# Patient Record
Sex: Male | Born: 1980 | Race: White | Hispanic: No | Smoking: Former smoker
Health system: Southern US, Community
[De-identification: ages and names within clinical notes are randomized; demographics above are authoritative.]

## PROBLEM LIST (undated history)

## (undated) DIAGNOSIS — T7840XA Allergy, unspecified, initial encounter: Secondary | ICD-10-CM

## (undated) DIAGNOSIS — E785 Hyperlipidemia, unspecified: Secondary | ICD-10-CM

## (undated) DIAGNOSIS — E119 Type 2 diabetes mellitus without complications: Secondary | ICD-10-CM

## (undated) DIAGNOSIS — K219 Gastro-esophageal reflux disease without esophagitis: Secondary | ICD-10-CM

## (undated) DIAGNOSIS — I10 Essential (primary) hypertension: Secondary | ICD-10-CM

## (undated) DIAGNOSIS — K859 Acute pancreatitis without necrosis or infection, unspecified: Secondary | ICD-10-CM

## (undated) HISTORY — PX: HERNIA REPAIR: SHX51

## (undated) HISTORY — DX: Allergy, unspecified, initial encounter: T78.40XA

## (undated) HISTORY — DX: Gastro-esophageal reflux disease without esophagitis: K21.9

## (undated) HISTORY — PX: TONSILLECTOMY: SUR1361

---

## 2004-06-28 ENCOUNTER — Emergency Department: Payer: Self-pay | Admitting: Emergency Medicine

## 2006-01-16 ENCOUNTER — Ambulatory Visit: Payer: Self-pay | Admitting: Otolaryngology

## 2008-05-28 ENCOUNTER — Ambulatory Visit: Payer: Self-pay | Admitting: General Practice

## 2008-07-10 ENCOUNTER — Ambulatory Visit: Payer: Self-pay | Admitting: Family

## 2010-01-10 ENCOUNTER — Ambulatory Visit: Payer: Self-pay | Admitting: General Practice

## 2010-01-20 ENCOUNTER — Ambulatory Visit: Payer: Self-pay | Admitting: General Practice

## 2011-11-30 ENCOUNTER — Inpatient Hospital Stay: Payer: Self-pay | Admitting: Internal Medicine

## 2011-11-30 LAB — CBC
MCV: 85 fL (ref 80–100)
Platelet: 330 10*3/uL (ref 150–440)
RDW: 13.3 % (ref 11.5–14.5)
WBC: 15.4 10*3/uL — ABNORMAL HIGH (ref 3.8–10.6)

## 2011-11-30 LAB — URINALYSIS, COMPLETE
Bacteria: NONE SEEN
Bilirubin,UR: NEGATIVE
Blood: NEGATIVE
Glucose,UR: NEGATIVE mg/dL (ref 0–75)
Nitrite: NEGATIVE
RBC,UR: <1 /HPF (ref 0–5)
Specific Gravity: 1.018 (ref 1.003–1.030)
Squamous Epithelial: NONE SEEN
WBC UR: <1 /HPF (ref 0–5)

## 2011-11-30 LAB — LIPID PANEL
Cholesterol: 235 mg/dL — ABNORMAL HIGH (ref 0–200)
HDL Cholesterol: 22 mg/dL — ABNORMAL LOW (ref 40–60)
Triglycerides: 1901 mg/dL — ABNORMAL HIGH (ref 0–200)

## 2011-11-30 LAB — COMPREHENSIVE METABOLIC PANEL
Anion Gap: 10 (ref 7–16)
BUN: 14 mg/dL (ref 7–18)
Bilirubin,Total: 0.6 mg/dL (ref 0.2–1.0)
Chloride: 98 mmol/L (ref 98–107)
Co2: 29 mmol/L (ref 21–32)
Creatinine: 0.76 mg/dL (ref 0.60–1.30)
EGFR (African American): >60
EGFR (Non-African Amer.): >60
Osmolality: 280 (ref 275–301)
Total Protein: 8 g/dL (ref 6.4–8.2)

## 2011-11-30 LAB — HEMOGLOBIN A1C: Hemoglobin A1C: 6.9 % — ABNORMAL HIGH (ref 4.2–6.3)

## 2011-12-01 LAB — CBC WITH DIFFERENTIAL/PLATELET
Basophil #: 0 10*3/uL (ref 0.0–0.1)
Eosinophil #: 0.2 10*3/uL (ref 0.0–0.7)
Eosinophil %: 1.5 %
Lymphocyte %: 19.7 %
MCHC: 32.8 g/dL (ref 32.0–36.0)
Monocyte %: 6.8 %
Neutrophil #: 8.6 10*3/uL — ABNORMAL HIGH (ref 1.4–6.5)
Neutrophil %: 71.6 %
RBC: 4.7 10*6/uL (ref 4.40–5.90)
RDW: 13.6 % (ref 11.5–14.5)

## 2011-12-01 LAB — COMPREHENSIVE METABOLIC PANEL
Alkaline Phosphatase: 63 U/L (ref 50–136)
Anion Gap: 8 (ref 7–16)
Bilirubin,Total: 0.6 mg/dL (ref 0.2–1.0)
Calcium, Total: 8.9 mg/dL (ref 8.5–10.1)
Chloride: 100 mmol/L (ref 98–107)
EGFR (African American): >60
EGFR (Non-African Amer.): >60
Osmolality: 277 (ref 275–301)
Potassium: 4 mmol/L (ref 3.5–5.1)
SGOT(AST): 46 U/L — ABNORMAL HIGH (ref 15–37)
Total Protein: 7.2 g/dL (ref 6.4–8.2)

## 2011-12-02 LAB — CBC WITH DIFFERENTIAL/PLATELET
Eosinophil %: 4.8 %
Lymphocyte #: 2.8 10*3/uL (ref 1.0–3.6)
Lymphocyte %: 34.9 %
MCH: 28.7 pg (ref 26.0–34.0)
MCV: 85 fL (ref 80–100)
Monocyte #: 0.5 x10 3/mm (ref 0.2–1.0)
Monocyte %: 6.5 %
Neutrophil #: 4.3 10*3/uL (ref 1.4–6.5)
Platelet: 279 10*3/uL (ref 150–440)

## 2011-12-02 LAB — LIPASE, BLOOD: Lipase: 155 U/L (ref 73–393)

## 2011-12-06 LAB — CULTURE, BLOOD (SINGLE)

## 2012-09-25 ENCOUNTER — Emergency Department: Payer: Self-pay | Admitting: Emergency Medicine

## 2012-10-10 ENCOUNTER — Ambulatory Visit: Payer: Self-pay | Admitting: General Practice

## 2012-12-26 ENCOUNTER — Emergency Department: Payer: Self-pay | Admitting: Emergency Medicine

## 2012-12-26 LAB — COMPREHENSIVE METABOLIC PANEL
Albumin: 4.5 g/dL (ref 3.4–5.0)
Alkaline Phosphatase: 88 U/L (ref 50–136)
Anion Gap: 4 — ABNORMAL LOW (ref 7–16)
BUN: 19 mg/dL — ABNORMAL HIGH (ref 7–18)
Bilirubin,Total: 0.2 mg/dL (ref 0.2–1.0)
Calcium, Total: 10.3 mg/dL — ABNORMAL HIGH (ref 8.5–10.1)
Chloride: 104 mmol/L (ref 98–107)
Co2: 31 mmol/L (ref 21–32)
EGFR (Non-African Amer.): >60
Glucose: 130 mg/dL — ABNORMAL HIGH (ref 65–99)
Osmolality: 282 (ref 275–301)
Sodium: 139 mmol/L (ref 136–145)
Total Protein: 8.5 g/dL — ABNORMAL HIGH (ref 6.4–8.2)

## 2012-12-26 LAB — LIPASE, BLOOD: Lipase: 117 U/L (ref 73–393)

## 2012-12-26 LAB — CBC
HCT: 43.2 % (ref 40.0–52.0)
MCH: 28.7 pg (ref 26.0–34.0)
MCHC: 34.6 g/dL (ref 32.0–36.0)
MCV: 83 fL (ref 80–100)
RDW: 13 % (ref 11.5–14.5)

## 2012-12-27 LAB — URINALYSIS, COMPLETE
Glucose,UR: NEGATIVE mg/dL (ref 0–75)
Ketone: NEGATIVE
Leukocyte Esterase: NEGATIVE
Nitrite: NEGATIVE
Ph: 5 (ref 4.5–8.0)
Protein: 30
RBC,UR: <1 /HPF (ref 0–5)
Specific Gravity: 1.054 (ref 1.003–1.030)
WBC UR: <1 /HPF (ref 0–5)

## 2014-01-15 ENCOUNTER — Ambulatory Visit: Payer: Self-pay | Admitting: Endocrinology

## 2014-09-05 ENCOUNTER — Emergency Department
Admit: 2014-09-05 | Disposition: A | Discharge status: Home / Self Care | Payer: Self-pay | Admitting: Emergency Medicine

## 2014-09-05 LAB — CBC WITH DIFFERENTIAL/PLATELET
Basophil #: 0.1 x10 3/mm 3
Basophil %: 0.4 %
Eosinophil #: 0.1 x10 3/mm 3
Eosinophil %: 0.9 %
HCT: 43.9 %
HGB: 14.8 g/dL
Lymphocyte %: 16.3 %
Lymphs Abs: 2.3 x10 3/mm 3
MCH: 27.8 pg
MCHC: 33.6 g/dL
MCV: 83 fL
Monocyte #: 1.2 "x10 3/mm " — ABNORMAL HIGH
Monocyte %: 8.5 %
Neutrophil #: 10.3 x10 3/mm 3 — ABNORMAL HIGH
Neutrophil %: 73.9 %
Platelet: 289 x10 3/mm 3
RBC: 5.32 x10 6/mm 3
RDW: 13.2 %
WBC: 13.9 x10 3/mm 3 — ABNORMAL HIGH

## 2014-09-05 LAB — URINALYSIS, COMPLETE
BACTERIA: NONE SEEN
BILIRUBIN, UR: NEGATIVE
Glucose,UR: >=500 mg/dL (ref 0–75)
Leukocyte Esterase: NEGATIVE
Nitrite: NEGATIVE
Ph: 6 (ref 4.5–8.0)
Specific Gravity: 1.04 (ref 1.003–1.030)
Squamous Epithelial: NONE SEEN

## 2014-09-05 LAB — COMPREHENSIVE METABOLIC PANEL
ALK PHOS: 66 U/L
ALT: 42 U/L
ANION GAP: 9 (ref 7–16)
AST: 23 U/L
Albumin: 4.2 g/dL
BUN: 14 mg/dL
Bilirubin,Total: 1.3 mg/dL — ABNORMAL HIGH
CREATININE: 0.74 mg/dL
Calcium, Total: 9.4 mg/dL
Chloride: 98 mmol/L — ABNORMAL LOW
Co2: 28 mmol/L
Glucose: 250 mg/dL — ABNORMAL HIGH
POTASSIUM: 4 mmol/L
SODIUM: 135 mmol/L
Total Protein: 8 g/dL

## 2014-09-05 LAB — TROPONIN I: Troponin-I: <0.03 ng/mL

## 2014-09-05 LAB — LIPASE, BLOOD: LIPASE: 28 U/L

## 2014-09-13 NOTE — H&P (Signed)
PATIENT NAME:  Dale Mendoza, Dale Mendoza MR#:  [id number] DATE OF BIRTH:  December 01, 1980  DATE OF ADMISSION:  11/30/2011  ADMITTING PHYSICIAN: Dr. Enid Baasadhika Dhairya Corales  PRIMARY CARE PHYSICIAN: Nonlocal  CHIEF COMPLAINT: Abdominal pain.   HISTORY OF PRESENT ILLNESS: Dale Mendoza is a pleasant 34 year old obese Caucasian male with past medical history significant for non-insulin-dependent diabetes mellitus and also seasonal allergies presents to the Emergency Room complaining of abdominal pain that started this morning. Patient said he has had intermittent mild abdominal discomfort over last couple of weeks. When he woke up he didn't feel right in the stomach. He was not nauseous, vomiting. He had his breakfast, he was fine but around 10:00 all of a sudden he started having spasmodic pain, 10/10 intensity, periumbilical and also in the flank bilaterally. He waited for a few hours to see if it go away but by 3:00 this afternoon pain got worse, he couldn't take it anymore and called his wife to get him to the Emergency Room. He still denies any nausea, vomiting. The pain is now in his right upper and also left upper quadrant. He say that he had dyspepsia symptoms in the past and had ultrasound done which showed gallbladder sludge at the time. He has seen a GI doctor at the time and he was put on Dexilant and that helped his dyspepsia symptoms. He has not had further of those symptoms at the time.   PAST MEDICAL HISTORY:  1. Non-insulin-dependent diabetes mellitus.  2. Gastroesophageal reflux disease.  3. Seasonal allergies.   PAST SURGICAL HISTORY:  1. Tonsillectomy.  2. Hernia surgery as a kid.   ALLERGIES: No known drug allergies.   MEDICATIONS AT HOME:  1. Metformin.  2. Glipizide.  3. Albuterol nebulizer for allergies.  4. Over-the-counter antihistamines as needed.  5. Flonase as needed.   SOCIAL HISTORY: Lives at home with his wife and has a daughter. No history of any smoking. Drinks beer occasionally. 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   FAMILY HISTORY: Father deceased, had metastatic cancer probably originating in the lung or kidney. Grandfather with diabetes and also prostate and pancreatic cancer. Does not know much about mother's medical problems.   REVIEW OF SYSTEMS: CONSTITUTIONAL: No fever, fatigue, or weakness. EYES: No blurred vision, double vision, inflammation, or glaucoma. ENT: No tinnitus, ear pain, hearing loss, epistaxis, or discharge. RESPIRATORY: No cough, wheeze, hemoptysis, or chronic obstructive pulmonary disease. CARDIOVASCULAR: No chest pain, orthopnea, edema, arrhythmia, palpitations, or syncope. GASTROINTESTINAL: Positive for abdominal pain. No nausea, vomiting, diarrhea, hematemesis, or rectal bleed. GENITOURINARY: No dysuria, hematuria, renal calculus, frequency, or incontinence. ENDOCRINE: No polyuria, nocturia, thyroid problems, heat or cold intolerance. HEMATOLOGY: No anemia, easy bruising or bleeding. SKIN: No acne, rash, or lesions. MUSCULOSKELETAL: No neck, back, shoulder pain, arthritis, or gout. NEUROLOGIC: No numbness, weakness, cerebrovascular accident, transient ischemic attack, or seizures. PSYCHOLOGICAL: No anxiety, insomnia, depression.   PHYSICAL EXAMINATION:  VITAL SIGNS: Temperature 97.6 degrees Fahrenheit, pulse 112, respirations 24, blood pressure 181/92, pulse oximetry 99% on room air.   GENERAL: Heavily built, well nourished male lying in bed in mild distress secondary to his abdominal pain.   HEENT: Normocephalic, atraumatic. Pupils equal, round, reacting to light. Anicteric sclerae. Extraocular movements intact. Oropharynx clear without erythema, mass, or exudates.   NECK: Supple. No thyromegaly, JVD, or carotid bruits. No lymphadenopathy.   LUNGS: Clear to auscultation bilaterally. No wheeze or crackles. No use of accessory muscles for breathing.   CARDIOVASCULAR: S1 and S2 regular rate  and rhythm. No murmurs, rubs, or gallops.    ABDOMEN: Mild tenderness in the right upper quadrant and also generalized tenderness periumbilical region and flank region. No guarding or rigidity. Hypoactive bowel sounds are present. No splenomegaly on examination.   EXTREMITIES: No pedal edema. No clubbing or cyanosis. 2+ dorsalis pedis pulses palpable bilaterally.   SKIN: No acne, rash, or lesions.   LYMPHATICS: No cervical lymphadenopathy.   NEUROLOGIC: Cranial nerves intact. No focal motor or sensory deficit.   PSYCHOLOGICAL: Patient is awake, alert, oriented x3.   LABORATORY, DIAGNOSTIC AND RADIOLOGICAL DATA:  WBC 15.4, hemoglobin 15.0, hematocrit 44.7, platelet count 330.   Sodium 137, potassium 3.9, chloride 98, bicarbonate 29, BUN 14, creatinine 0.76, glucose 207, calcium 9.1.   ALT 87, AST 79, albumin 4.0, alkaline phosphatase 79, total bilirubin 0.6. Lipase elevated at 2879. Urinalysis negative for any infection. CT of the abdomen and pelvis showing no ureterolithiasis or obstructive uropathy. Normal appendix. Hepatic steatosis. Normal gallbladder and also pancreas appears normal.   ASSESSMENT AND PLAN: 34 year old male with past medical history of diabetes and gastroesophageal reflux disease and gallbladder sludge history in the past admitted for acute abdominal pain. Labs showing elevated lipase.  1. Acute pancreatitis. Could be biliary pancreatitis, CT, however, showing normal gallbladder and pancreas but it was done without contrast. Will get an abdominal ultrasound done. Keep him n.p.o., IV fluids, pain and nausea medications. Follow up repeat lipase in a.m., and also check lipid profile. If ultrasound shows any gallbladder issues might need surgical consult especially with prior history of gallbladder sludge and dyspepsia symptoms secondary to that.  2. Accelerated hypertension. Remote history of hypertension for which she was treated, was linked related to stress and has not been hypertensive so was taken off medications.  Current episode of elevated blood pressure could be related to pain. Manage with IV hydralazine p.Mendoza.n. at this time and monitor.  3. Diabetes mellitus. Since he is n.p.o. will hold his metformin and glipizide and start him on sliding scale insulin. Follow up his hemoglobin A1c. 4. Leukocytosis, likely stress reaction. IV fluids and follow up in a.m.. He is not febrile and does not look septic so will hold off on antibiotics at this time.  5. Elevated LFTs probably from gallbladder disease and pancreatitis. Monitor in a.m. Also CT shows hepatic steatosis. Might benefit from losing some weight.  6. Gastroesophageal reflux disease. IV Protonix b.i.d.  7. Allergies. Stable for now.  8. CODE STATUS: FULL CODE.   TIME SPENT ON ADMISSION: 50 minutes.   ____________________________ Enid Baas, MD rk:cms D: 11/30/2011 21:03:47 ET T: 12/01/2011 06:56:13 ET JOB#: [id number]  cc: Enid Baas, MD, <Dictator>  Enid Baas MD ELECTRONICALLY SIGNED 12/01/2011 15:49

## 2014-09-13 NOTE — Consult Note (Signed)
PATIENT NAME:  Dale Mendoza, Dale Mendoza MR#:  [id number] DATE OF BIRTH:  1980-10-02  DATE OF CONSULTATION:  12/01/2011  REFERRING PHYSICIAN:   CONSULTING PHYSICIAN:  Earvin HansenGerald L. Pernell DupreAdams, MD  HISTORY OF PRESENT ILLNESS: This is a 34 year old man who is admitted to the hospital with upper abdominal flank pain. He was found to have a lipase of 2879 at admission. CT scan did not show any abnormalities of the biliary tract nor was the pancreas enlarged or inflamed. A gallbladder ultrasound was performed which is completely normal showing no evidence of cholecystitis, stones, or biliary tract obstruction. At the time of this examination his pain is gone, he feels much better. He is a type II diabetic and takes metformin and glipizide. We also note that his lipid profile shows a very elevated triglyceride. His total cholesterol was also elevated.    PAST MEDICAL HISTORY: The patient has not had any surgery and essentially his only illnesses is his diabetes.   PHYSICAL EXAMINATION: The patient is overweight although not morbidly obese. There are no scars on the abdomen. There is no tenderness, guarding, rigidity, or rebound. Bowel sounds are normal.   IMPRESSION: Acute pancreatitis, resolving.   He does not need a cholecystectomy or any sort of biliary tract surgery. Perhaps attention to his lipid profile may be helpful as this can be an etiology for pancreatitis.   ____________________________ Yaakov GuthrieGerald L. Pernell DupreAdams, MD gla:drc D: 12/01/2011 20:47:46 ET T: 12/02/2011 09:03:10 ET JOB#: [id number]  cc: Earvin HansenGerald L. Pernell DupreAdams, MD, <Dictator> Elmer SowGERALD L Morris Longenecker MD ELECTRONICALLY SIGNED 12/02/2011 19:39

## 2014-09-13 NOTE — Discharge Summary (Signed)
PATIENT NAME:  Dale Mendoza, Dale Mendoza MR#:  [id number] DATE OF BIRTH:  February 25, 1981  DATE OF ADMISSION:  11/30/2011 DATE OF DISCHARGE:  12/02/2011  ADMITTING DIAGNOSIS: Acute pancreatitis.   DISCHARGE DIAGNOSES: 1. Acute pancreatitis due to hypertriglyceridemia.  2. Hypertriglyceridemia.  3. Diabetes mellitus. Hemoglobin A1c 6.9.  4. Gastroesophageal reflux disease.  5. Obesity.  6. Seasonal allergies.  7. Tonsillectomy.  8. Hernia surgery as a kid.   DISCHARGE CONDITION: Fair.   DISCHARGE MEDICATIONS: The patient is to resume his outpatient medications which are: 1. Metformin 1 gram twice daily.  2. Glipizide 2.5 mg p.o. daily.  3. He is to start fenofibrate 145 mg p.o. daily.   DO NOT TAKE: He is not to take albuterol unless prescribed by primary care physician.   DIET: 2 gram salt, 1800 ADA, low fat, low cholesterol. The patient was advised to lose weight if possible, at least 5% to help him control lipids in the bloodstream.   ACTIVITY LIMITATIONS: As tolerated.   FOLLOW-UP: Follow-up appointment with primary care physician, Dr. Dorothey Baseman, [facility].   HISTORY OF PRESENT ILLNESS: The patient is a 34 year old male with past medical history significant for history of diabetes, hypertension, obesity, and hyperlipidemia who presented to the hospital with complaints of abdominal pains. Please refer to Dr. Prudencio Pair admission note on 11/30/2011.   PHYSICAL EXAMINATION: On arrival to the hospital, the patient's temperature was 97.6, pulse 112, respirations 24, blood pressure 181/92, saturation was 99% on room air. Physical exam revealed tenderness in the right upper quadrant as well as generalized tenderness in the periumbilical area as well as flank region. Hypoactive bowel sounds were present.  LABORATORY, DIAGNOSTIC, AND RADIOLOGICAL DATA: On 11/30/2011 glucose 207, otherwise BMP was unremarkable. The patient's hemoglobin A1c was checked and was found to be 6.9. The patient's  lipase level was elevated to 2879. HDL was 22. Triglycerides were 1,901. Total cholesterol was 235, however, LDL or VLDL were not able to calculate due to triglyceride level being more than 400 mg/dL and greater. It was felt that the patient's acute pancreatitis was related to hypertriglyceridemia and the patient was admitted to the hospital for further evaluation. Initially on arrival to the hospital, the patient's lab studies also revealed mild elevation of AST to 79 and ALT to 87, however, those seemed to be also resolving. Repeated lab data on 12/01/2011 showed just mild elevation of AST to 46 which could be related to alcohol use. The patient's white blood cell count initially on arrival to the hospital was elevated to 15.4, hemoglobin was 15.0, platelet count 330. Blood cultures x2 did not show any growth. Urinalysis was unremarkable.   EKG showed sinus tachy at 109 beats per minute, possible left atrial enlargement, no acute ST-T changes, however, were noted.   CT scan of abdomen and pelvis without contrast on 11/30/2011 showed no urolithiasis or obstructive uropathy, normal appendix, hepatic steatosis was noted.   Ultrasound of abdomen, limited survey, 11/30/2011, revealed no cholelithiasis or sonographic evidence of acute cholecystitis but hepatic steatosis again was noted.   HOSPITAL COURSE: The patient was, as mentioned above, admitted to the hospital for further evaluation. He was started on n.p.o., IV fluids as well as pain medications for pain control as well as PPIs. With this conservative therapy, he improved significantly. Over the next few days he was able to tolerate his diet. On 12/02/2011 after his diet was reinitiated his lipase level is normal at 155 and he does not have anymore pains. He was  advised to lose weight to control his lipids in the bloodstream. He was also advised not to drink any alcohol due to concerns of acute pancreatitis. He is being discharged in stable condition with  the above-mentioned medications and follow-up. His vital signs on the day of discharge included temperature 97.2, pulse 83, respiration rate 16, blood pressure 158/95, however, ranging from 119 to 158 systolic. Oxygen saturation was 96% on room air at rest.  1. In regards to diabetes mellitus, the patient was continued on sliding scale insulin because he was n.p.o. while in the hospital and the patient's glucose levels were ranging around 140's to 170's. The patient was advised to continue his diabetic medications and follow-up with his primary care physician, Dr. Dorothey BasemanStrickland, for further recommendations.  2. In regards to hypertension, the patient's blood pressure was markedly elevated initially on arrival to the hospital, however, it was felt to be stress as well as pain related. When the patient's pain was better controlled, the patient's blood pressure also improved. The patient may need to be started on blood pressure medication such as lisinopril if his systolic blood pressure remains elevated on the next office visit at Dr. Drucie OpitzStrickland's office visit. The patient is being discharged in stable condition as mentioned above and he is to follow-up with his primary care physician for hypertriglyceridemia. The patient was initiated on fenofibrate. It is recommended to follow the patient's lipid panel as outpatient as well as liver function tests as outpatient to ensure its stability.   TIME SPENT: 40 minutes.  ____________________________ Katharina Caperima Leyanna Bittman, MD rv:drc D: 12/02/2011 12:47:13 ET T: 12/02/2011 13:17:07 ET JOB#: [id number]  cc: Katharina Caperima Otha Rickles, MD, <Dictator> Quita SkyeJames D. Dorothey BasemanStrickland, MD Katharina CaperIMA Ewel Lona MD ELECTRONICALLY SIGNED 12/10/2011 18:38

## 2015-05-13 ENCOUNTER — Other Ambulatory Visit: Payer: Self-pay | Admitting: Physician Assistant

## 2015-05-25 ENCOUNTER — Other Ambulatory Visit: Payer: Self-pay | Admitting: Emergency Medicine

## 2015-05-25 MED ORDER — LISINOPRIL 10 MG PO TABS: 10.0000 mg | ORAL_TABLET | Freq: Every day | ORAL | Status: DC

## 2015-05-25 MED ORDER — FENOFIBRATE 145 MG PO TABS: 145.0000 mg | ORAL_TABLET | Freq: Every day | ORAL | Status: DC

## 2015-05-25 NOTE — Telephone Encounter (Signed)
Given 1 refill only, pt needs to be seen for fasting labs as has not been seen since June 2016;

## 2015-05-25 NOTE — Telephone Encounter (Signed)
Received a faxed medication request from [facility].  Please advise.  Thank you. 

## 2015-06-16 ENCOUNTER — Ambulatory Visit: Payer: Self-pay | Admitting: Physician Assistant

## 2015-06-16 ENCOUNTER — Encounter: Payer: Self-pay | Admitting: Physician Assistant

## 2015-06-16 VITALS — BP 140/80 | HR 108 | Temp 97.8°F

## 2015-06-16 DIAGNOSIS — K219 Gastro-esophageal reflux disease without esophagitis: Secondary | ICD-10-CM

## 2015-06-16 DIAGNOSIS — E8881 Metabolic syndrome: Secondary | ICD-10-CM

## 2015-06-16 MED ORDER — RANITIDINE HCL 150 MG PO TABS: 150.0000 mg | ORAL_TABLET | Freq: Two times a day (BID) | ORAL | Status: DC

## 2015-06-16 NOTE — Progress Notes (Signed)
S: here for fasting labs, having intermittent ruq and r upper back pain, no fever, chills, is dependent on what he eats, has diarrhea after greasy foods like pizza, pain in ruq is worse with spaghetti sauce, no affected by the sausage biscuits he eats every morning, states pain is better if takes zantac ;  pmhx included metabolic syndrome, pancreatitis, hospitalized for pancreatitis that was caused by extremely high triglycerides, (about 2 years ago); did have a hida scan a few years ago and was told he had sludge in his gallbladder; has been noncompliant with f/u to endocrinology, father had renal CA that spread throughout his body prior to being diagnosed , was stage 4 on dx; was age 35 at death;   O: vitals wnl, nad, appears well, lungs c t a, cv rrr, abd soft nontender bs normal all 4 quads  A: metabolic syndrome, ruq pain  P: zantac  1 or 2 po qd, fasting labs drawn, hgba1c, lipase; will once again refer to endocrinology, explained to patient that he needs to see a specialist that he is beyond what we do in the clinic and should seek care from an endocrinologist, also suggested he see GI, pt wants to see what his labs are prior to making GI appointment

## 2015-06-17 LAB — CMP12+LP+TP+TSH+6AC+CBC/D/PLT
ALT: 57 IU/L — ABNORMAL HIGH (ref 0–44)
AST: 45 IU/L — ABNORMAL HIGH (ref 0–40)
Albumin/Globulin Ratio: 1.8 (ref 1.1–2.5)
Albumin: 4.9 g/dL (ref 3.5–5.5)
Alkaline Phosphatase: 72 IU/L (ref 39–117)
BUN / CREAT RATIO: 17 (ref 8–19)
BUN: 13 mg/dL (ref 6–20)
Basophils Absolute: 0.1 10*3/uL (ref 0.0–0.2)
Basos: 1 %
Bilirubin Total: 0.2 mg/dL (ref 0.0–1.2)
CHLORIDE: 100 mmol/L (ref 96–106)
CREATININE: 0.77 mg/dL (ref 0.76–1.27)
Calcium: 10 mg/dL (ref 8.7–10.2)
Chol/HDL Ratio: 7.3 ratio units — ABNORMAL HIGH (ref 0.0–5.0)
Cholesterol, Total: 219 mg/dL — ABNORMAL HIGH (ref 100–199)
EOS (ABSOLUTE): 0.3 10*3/uL (ref 0.0–0.4)
EOS: 3 %
ESTIMATED CHD RISK: 1.5 times avg. — AB (ref 0.0–1.0)
Free Thyroxine Index: 2.1 (ref 1.2–4.9)
GFR calc Af Amer: 139 mL/min/{1.73_m2} (ref >59)
GFR, EST NON AFRICAN AMERICAN: 120 mL/min/{1.73_m2} (ref >59)
GGT: 74 IU/L — AB (ref 0–65)
GLUCOSE: 140 mg/dL — AB (ref 65–99)
Globulin, Total: 2.7 g/dL (ref 1.5–4.5)
HDL: 30 mg/dL — ABNORMAL LOW (ref >39)
Hematocrit: 43.3 % (ref 37.5–51.0)
Hemoglobin: 14.4 g/dL (ref 12.6–17.7)
IRON: 61 ug/dL (ref 38–169)
Immature Grans (Abs): 0.1 10*3/uL (ref 0.0–0.1)
Immature Granulocytes: 1 %
LDH: 155 IU/L (ref 121–224)
LYMPHS ABS: 2.9 10*3/uL (ref 0.7–3.1)
Lymphs: 35 %
MCH: 28.1 pg (ref 26.6–33.0)
MCHC: 33.3 g/dL (ref 31.5–35.7)
MCV: 85 fL (ref 79–97)
Monocytes Absolute: 0.5 10*3/uL (ref 0.1–0.9)
Monocytes: 6 %
NEUTROS ABS: 4.4 10*3/uL (ref 1.4–7.0)
Neutrophils: 54 %
PLATELETS: 263 10*3/uL (ref 150–379)
POTASSIUM: 4.4 mmol/L (ref 3.5–5.2)
Phosphorus: 3.4 mg/dL (ref 2.5–4.5)
RBC: 5.12 x10E6/uL (ref 4.14–5.80)
RDW: 13.9 % (ref 12.3–15.4)
SODIUM: 140 mmol/L (ref 134–144)
T3 UPTAKE RATIO: 33 % (ref 24–39)
T4, Total: 6.3 ug/dL (ref 4.5–12.0)
TOTAL PROTEIN: 7.6 g/dL (ref 6.0–8.5)
TSH: 1.34 u[IU]/mL (ref 0.450–4.500)
Triglycerides: 706 mg/dL (ref 0–149)
Uric Acid: 5.1 mg/dL (ref 3.7–8.6)
WBC: 8.2 10*3/uL (ref 3.4–10.8)

## 2015-06-17 LAB — LIPASE: Lipase: 23 U/L (ref 0–59)

## 2015-06-17 LAB — HEMOGLOBIN A1C
ESTIMATED AVERAGE GLUCOSE: 157 mg/dL
HEMOGLOBIN A1C: 7.1 % — AB (ref 4.8–5.6)

## 2015-06-18 ENCOUNTER — Other Ambulatory Visit: Payer: Self-pay | Admitting: Physician Assistant

## 2015-06-21 ENCOUNTER — Other Ambulatory Visit: Payer: Self-pay | Admitting: Emergency Medicine

## 2015-06-21 MED ORDER — GLIPIZIDE 5 MG PO TABS: 5.0000 mg | ORAL_TABLET | Freq: Two times a day (BID) | ORAL | Status: DC

## 2015-06-21 MED ORDER — FENOFIBRATE 145 MG PO TABS: 145.0000 mg | ORAL_TABLET | Freq: Every day | ORAL | Status: DC

## 2015-06-21 MED ORDER — METFORMIN HCL 1000 MG PO TABS: 1000.0000 mg | ORAL_TABLET | Freq: Two times a day (BID) | ORAL | Status: DC

## 2015-06-21 MED ORDER — LISINOPRIL 10 MG PO TABS: 10.0000 mg | ORAL_TABLET | Freq: Two times a day (BID) | ORAL | Status: DC

## 2015-06-21 NOTE — Progress Notes (Signed)
I spoke with the patient about his lab results and he expressed understanding.  Referral for Endocrinology was sent to [facility] in [place] to see Dr. Renae Fickle.

## 2015-06-21 NOTE — Telephone Encounter (Signed)
Agreed to refill rx x 2; pt once again told he must follow up with endocrinology as this will be the last refill if he does not follow up as instructed

## 2015-06-21 NOTE — Telephone Encounter (Signed)
Patient called and requested a refill for the following medication: Lisinopril , Fenofibrate, Glipizide and Metformin.  He wants the refills sent to [facility].

## 2015-07-02 NOTE — Progress Notes (Signed)
Patient ID: Dale Mendoza, male   DOB: [birth year], 35 y.o.   MRN: [id number] I called [facility] in [place] to find out if an appointment was made for the patient to see Dr. Renae Fickle.  Per Crystal, they scheduled the patient for 08/12/15 at 10:45am.  They have contacted the patient.

## 2015-07-05 ENCOUNTER — Ambulatory Visit: Payer: Self-pay | Admitting: Physician Assistant

## 2015-07-05 ENCOUNTER — Encounter: Payer: Self-pay | Admitting: Physician Assistant

## 2015-07-05 VITALS — BP 130/70 | HR 104 | Temp 98.2°F

## 2015-07-05 DIAGNOSIS — J4520 Mild intermittent asthma, uncomplicated: Secondary | ICD-10-CM

## 2015-07-05 DIAGNOSIS — J069 Acute upper respiratory infection, unspecified: Secondary | ICD-10-CM

## 2015-07-05 DIAGNOSIS — J302 Other seasonal allergic rhinitis: Secondary | ICD-10-CM

## 2015-07-05 MED ORDER — ALBUTEROL SULFATE HFA 108 (90 BASE) MCG/ACT IN AERS: 1.0000 | INHALATION_SPRAY | Freq: Four times a day (QID) | RESPIRATORY_TRACT | Status: DC | PRN

## 2015-07-05 MED ORDER — AMOXICILLIN 875 MG PO TABS: 875.0000 mg | ORAL_TABLET | Freq: Two times a day (BID) | ORAL | Status: DC

## 2015-07-05 NOTE — Progress Notes (Signed)
S/ 4 d hx of upper resp sxs, cough productive at times, taking otcs , hx of seasonal allergies and RAD    O/ VSS alert mildly ill NAD ENT nasal mucosa red swollen with rhinorhea sinuses not tender Throat increased pnd neck supple heart rsr lungs clear  A/Acute upper respiratory infection   viral course discussed and supportive measures encouraged. rx amoxicillan to fill if course past viral timeline  Seasonal allergies  allergy tips reviewed and encouraged   RAD (reactive airway disease), mild intermittent, uncomplicated rx for albuterol inhaler

## 2015-07-21 ENCOUNTER — Ambulatory Visit: Payer: Self-pay | Admitting: Registered Nurse

## 2015-07-21 VITALS — BP 139/82 | HR 95 | Temp 97.9°F

## 2015-07-21 DIAGNOSIS — J0101 Acute recurrent maxillary sinusitis: Secondary | ICD-10-CM

## 2015-07-21 DIAGNOSIS — H6593 Unspecified nonsuppurative otitis media, bilateral: Secondary | ICD-10-CM

## 2015-07-21 DIAGNOSIS — J209 Acute bronchitis, unspecified: Secondary | ICD-10-CM

## 2015-07-21 DIAGNOSIS — J301 Allergic rhinitis due to pollen: Secondary | ICD-10-CM

## 2015-07-21 MED ORDER — PREDNISONE 50 MG PO TABS: 50.0000 mg | ORAL_TABLET | Freq: Every day | ORAL | Status: AC

## 2015-07-21 MED ORDER — DOXYCYCLINE HYCLATE 100 MG PO TABS: 100.0000 mg | ORAL_TABLET | Freq: Two times a day (BID) | ORAL | Status: DC

## 2015-07-21 MED ORDER — ACETAMINOPHEN 500 MG PO TABS: 1000.0000 mg | ORAL_TABLET | Freq: Four times a day (QID) | ORAL | Status: AC | PRN

## 2015-07-21 MED ORDER — LORATADINE 10 MG PO TABS: 10.0000 mg | ORAL_TABLET | Freq: Every day | ORAL | Status: DC

## 2015-07-21 MED ORDER — FLUTICASONE PROPIONATE 50 MCG/ACT NA SUSP: 1.0000 | Freq: Two times a day (BID) | NASAL | Status: DC

## 2015-07-21 NOTE — Progress Notes (Signed)
Subjective:    Patient ID: Dale Mendoza, male    DOB: [birth year], 35 y.o.   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  Typically seasonal spring allergies still running fever took tylenol at home this morning flonase 2 sprays each nostril BID as wearing off dinner time noticing increase post nasal drip using albuterol MDI once a day for wheezing; ears feel full like fluid in them, nasal congestion yellow opaque sputum with cough  Cough This is a recurrent problem. The current episode started 1 to 4 weeks ago. The problem has been unchanged. The problem occurs constantly. The cough is productive of sputum. Associated symptoms include chills, ear congestion, a fever, headaches, myalgias, nasal congestion, postnasal drip, rhinorrhea, sweats and wheezing. Pertinent negatives include no chest pain, ear pain, eye redness, heartburn, hemoptysis, rash, sore throat, shortness of breath or weight loss. The symptoms are aggravated by lying down, pollens, exercise and dust. He has tried a beta-agonist inhaler and rest for the symptoms. The treatment provided mild relief. His past medical history is significant for bronchitis and environmental allergies. There is no history of asthma, bronchiectasis, COPD, emphysema or pneumonia.  Ear Fullness  Associated symptoms include coughing, headaches and rhinorrhea. Pertinent negatives include no abdominal pain, diarrhea, ear discharge, hearing loss, neck pain, rash, sore throat or vomiting.      Review of Systems  Constitutional: Positive for fever and chills. Negative for weight loss, diaphoresis, activity change, appetite change, fatigue and unexpected weight change.  HENT: Positive for congestion, postnasal drip, rhinorrhea and sinus pressure. Negative for dental problem, drooling, ear  discharge, ear pain, facial swelling, hearing loss, mouth sores, nosebleeds, sneezing, sore throat, tinnitus, trouble swallowing and voice change.   Eyes: Negative for photophobia, pain, discharge, redness, itching and visual disturbance.  Respiratory: Positive for cough and wheezing. Negative for hemoptysis, choking, chest tightness, shortness of breath and stridor.   Cardiovascular: Negative for chest pain, palpitations and leg swelling.  Gastrointestinal: Negative for heartburn, nausea, vomiting, abdominal pain, diarrhea, constipation, blood in stool and abdominal distention.  Endocrine: Negative for cold intolerance and heat intolerance.  Genitourinary: Negative for dysuria.  Musculoskeletal: Positive for myalgias. Negative for back pain, joint swelling, arthralgias, gait problem, neck pain and neck stiffness.  Skin: Negative for color change, pallor, rash and wound.  Allergic/Immunologic: Positive for environmental allergies. Negative for food allergies and immunocompromised state.  Neurological: Positive for headaches. Negative for dizziness, tremors, seizures, syncope, facial asymmetry, speech difficulty, weakness, light-headedness and numbness.  Hematological: Negative for adenopathy. Does not bruise/bleed easily.  Psychiatric/Behavioral: Negative for behavioral problems, confusion, sleep disturbance and agitation.       Objective:   Physical Exam  Constitutional: He is oriented to person, place, and time. Vital signs are normal. He appears well-developed and well-nourished. He is active and cooperative.  Non-toxic appearance. He does not have a sickly appearance. He appears ill. No distress.  HENT:  Head: Normocephalic and atraumatic.  Right Ear: Hearing, external ear and ear canal normal. A middle ear effusion is present.  Left Ear: Hearing, external ear and ear canal normal. A middle ear effusion is present.  Nose: Mucosal edema and rhinorrhea present. No nose lacerations, sinus  tenderness, nasal deformity, septal deviation or nasal septal hematoma. No epistaxis.  No foreign bodies. Right sinus exhibits maxillary sinus tenderness. Right sinus exhibits  no frontal sinus tenderness. Left sinus exhibits maxillary sinus tenderness. Left sinus exhibits no frontal sinus tenderness.  Mouth/Throat: Uvula is midline and mucous membranes are normal. Mucous membranes are not pale, not dry and not cyanotic. He does not have dentures. No oral lesions. No trismus in the jaw. Normal dentition. No dental abscesses, uvula swelling, lacerations or dental caries. Posterior oropharyngeal edema and posterior oropharyngeal erythema present. No oropharyngeal exudate or tonsillar abscesses.  Cobblestoning posterior pharynx; bilateral TMs with air fluid level clear; bilateral nasal turbinates edema/erythema/yellow discharge, nasal congestion  Eyes: Conjunctivae, EOM and lids are normal. Pupils are equal, round, and reactive to light. Right eye exhibits no chemosis, no discharge, no exudate and no hordeolum. No foreign body present in the right eye. Left eye exhibits no chemosis, no discharge, no exudate and no hordeolum. No foreign body present in the left eye. Right conjunctiva is not injected. Right conjunctiva has no hemorrhage. Left conjunctiva is not injected. Left conjunctiva has no hemorrhage. No scleral icterus. Right eye exhibits normal extraocular motion and no nystagmus. Left eye exhibits normal extraocular motion and no nystagmus. Right pupil is round and reactive. Left pupil is round and reactive. Pupils are equal.  Neck: Trachea normal and normal range of motion. Neck supple. No tracheal tenderness, no spinous process tenderness and no muscular tenderness present. No rigidity. No tracheal deviation, no edema, no erythema and normal range of motion present. No thyroid mass and no thyromegaly present.  Cardiovascular: Normal rate, regular rhythm, S1 normal, S2 normal, normal heart sounds and intact  distal pulses.  PMI is not displaced.  Exam reveals no gallop and no friction rub.   No murmur heard. Pulmonary/Chest: Effort normal and breath sounds normal. No stridor. No respiratory distress. He has no decreased breath sounds. He has no wheezes. He has no rhonchi. He has no rales.  Abdominal: Soft. He exhibits no distension.  Musculoskeletal: Normal range of motion. He exhibits no edema or tenderness.  Lymphadenopathy:       Head (right side): No submental, no submandibular, no tonsillar, no preauricular, no posterior auricular and no occipital adenopathy present.       Head (left side): No submental, no submandibular, no tonsillar, no preauricular, no posterior auricular and no occipital adenopathy present.    He has no cervical adenopathy.       Right cervical: No superficial cervical, no deep cervical and no posterior cervical adenopathy present.      Left cervical: No superficial cervical, no deep cervical and no posterior cervical adenopathy present.  Neurological: He is alert and oriented to person, place, and time. He displays no atrophy and no tremor. No cranial nerve deficit or sensory deficit. He exhibits normal muscle tone. He displays no seizure activity. Coordination and gait normal. GCS eye subscore is 4. GCS verbal subscore is 5. GCS motor subscore is 6.  Skin: Skin is warm, dry and intact. No abrasion, no bruising, no burn, no ecchymosis, no laceration, no lesion, no petechiae and no rash noted. He is not diaphoretic. No cyanosis or erythema. No pallor. Nails show no clubbing.  Psychiatric: He has a normal mood and affect. His speech is normal and behavior is normal. Judgment and thought content normal. Cognition and memory are normal.  Nursing note and vitals reviewed.         Assessment & Plan:  A- recurrent maxillary sinusitis chronic, otitis media with effusion, viral illness, bronchitis with history reactive airway disease P-Supportive treatment.   No evidence of  invasive bacterial infection, non toxic and well hydrated.  This is most likely self limiting viral infection.  I do not see where any further testing or imaging is necessary at this time.   I will suggest supportive care, rest, good hygiene and encourage the patient to take adequate fluids.  The patient is to return to clinic or EMERGENCY ROOM if symptoms worsen or change significantly e.g. ear pain, fever, purulent discharge from ears or bleeding.  Patient verbalized agreement and understanding of treatment plan.     Suspect Viral illness: no evidence of invasive bacterial infection, non toxic and well hydrated.  This is most likely self limiting viral infection.  I do not see where any further testing or imaging is necessary at this time.   I will suggest supportive care, rest, good hygiene and encourage the patient to take adequate fluids.  Does not require work excuse.   flonase 1 spray each nostril BID prn, nasal saline 1-2 sprays each nostril prn q2h, tylenol  po QID prn  Hydrate hydrate hydrate,  humidifer use.  Discussed honey with lemon and salt water gargles for comfort also.  The patient is to return to clinic or EMERGENCY ROOM if symptoms worsen or change significantly e.g. fever, lethargy, SOB, wheezing.  Patient verbalized agreement and understanding of treatment plan.    Refilled flonase change to 1 spray each nostril BID, saline 2 sprays each nostril q2h prn congestion. Doxycycline  po BID x 10 days, start claritin  po daily refilled, nasal saline 2 sprays each nostril q2h prn congestion.  Rx given.  No evidence of systemic bacterial infection, non toxic and well hydrated.  I do not see where any further testing or imaging is necessary at this time.   I will suggest supportive care, rest, good hygiene and encourage the patient to take adequate fluids.  The patient is to return to clinic or EMERGENCY ROOM if symptoms worsen or change significantly. Patient verbalized agreement and  understanding of treatment plan and had no further questions at this time.   P2:  Hand washing and cover cough  Doxycycline  po BID x 10 days and prednisone  po daily with breakfast x 5 days.  Start albuterol ATC 2 puffs TID-QID x 2 days.  Discussed prednisone will increase blood sugars avoid concentrated sugar intakeBronchitis simple, community acquired, may have started as viral (probably respiratory syncytial, parainfluenza, influenza, or adenovirus), but now evidence of acute purulent bronchitis with resultant bronchial edema and mucus formation.  Viruses are the most common cause of bronchial inflammation in otherwise healthy adults with acute bronchitis.  The appearance of sputum is not predictive of whether a bacterial infection is present.  Purulent sputum is most often caused by viral infections.  There are a small portion of those caused by non-viral agents being Mycoplamsa pneumonia.  Microscopic examination or C&S of sputum in the healthy adult with acute bronchitis is generally not helpful (usually negative or normal respiratory flora) other considerations being cough from upper respiratory tract infections, sinusitis or allergic syndromes (mild asthma or viral pneumonia).  Differential Diagnosis:  reactive airway disease (asthma, allergic aspergillosis (eosinophilia), chronic bronchitis, respiratory infection (Sinusitis, Common cold, pneumonia), congestive heart failure, reflux esophagitis, bronchogenic tumor, aspiration syndromes and/or exposure irritants/tobacco smoke.  In this case, there is no evidence of any invasive bacterial illness.  Most likely viral etiology so will hold on antibiotic treatment.  Advise supportive care with rest, encourage fluids, good hygiene and watch for any worsening symptoms.  If they were to develop:  come back to the office or go to the emergency room if after hours. Without high fever, severe dyspnea, lack of physical findings or other risk factors, I  will hold on a chest radiograph and CBC at this time.I discussed that approximately 50% of patients with acute bronchitis have a cough that lasts up to three weeks, and 25% for over a month.  Tylenol, one to two tablets every four hours as needed for fever or myalgias.   No aspirin.  Patient instructed to follow up in one week or sooner if symptoms worsen. Patient verbalized agreement and understanding of treatment plan.  P2:  hand washing and cover cough

## 2015-08-27 ENCOUNTER — Encounter: Payer: Self-pay | Admitting: Physician Assistant

## 2015-08-27 ENCOUNTER — Other Ambulatory Visit: Payer: Self-pay | Admitting: Physician Assistant

## 2015-08-27 ENCOUNTER — Ambulatory Visit: Payer: Self-pay | Admitting: Physician Assistant

## 2015-08-27 VITALS — BP 140/90 | HR 95 | Temp 98.1°F

## 2015-08-27 DIAGNOSIS — J038 Acute tonsillitis due to other specified organisms: Secondary | ICD-10-CM

## 2015-08-27 MED ORDER — LIDOCAINE VISCOUS 2 % MT SOLN: 5.0000 mL | Freq: Four times a day (QID) | OROMUCOSAL | Status: DC | PRN

## 2015-08-27 MED ORDER — AMOXICILLIN-POT CLAVULANATE 875-125 MG PO TABS: 1.0000 | ORAL_TABLET | Freq: Two times a day (BID) | ORAL | Status: DC

## 2015-08-27 MED ORDER — PSEUDOEPH-BROMPHEN-DM 30-2-10 MG/5ML PO SYRP: 5.0000 mL | ORAL_SOLUTION | Freq: Four times a day (QID) | ORAL | Status: DC | PRN

## 2015-08-27 MED ORDER — METFORMIN HCL ER (OSM) 1000 MG PO TB24: 1000.0000 mg | ORAL_TABLET | Freq: Two times a day (BID) | ORAL | Status: DC

## 2015-08-27 NOTE — Progress Notes (Signed)
Pt states last rx for metformin was different, usually takes the ER got the regular and it's upsetting his stomach  Sent rx for metformin XR 1000mg  bid to [facility], 2 refills, pt to f/u with endocrinology

## 2015-08-27 NOTE — Progress Notes (Signed)
   Subjective: Sore Throat    Patient ID: Dale Mendoza, male    DOB: 1980-07-22, 35 y.o.   MRN: [id number]  HPI Patient c/o 3 days of sore throat. States "hurts to swallow".  Denies fever/chill. States mild nasal congestion and cough.  No ppalliative measures for compliant.    Review of Systems Diabetes and Hypertension    Objective:   Physical Exam Bilateral tonsil edema, erythema, and exudate.  Bilateral cervical adenopathy. Neck supple. Lungs CTA and Heart RRR.      Assessment & Plan:  Exudative pharyngitis. Augmentin, Viscous Lidocaine, and Bromfed DM as directed.  Follow up 3 days if no improvement.

## 2015-09-06 ENCOUNTER — Other Ambulatory Visit: Payer: Self-pay | Admitting: Emergency Medicine

## 2015-09-06 MED ORDER — FENOFIBRATE 145 MG PO TABS: 145.0000 mg | ORAL_TABLET | Freq: Every day | ORAL | Status: DC

## 2015-09-06 NOTE — Telephone Encounter (Signed)
Pt is scheduled to see endocrinology, will do this one refill and then want to defer to pcp

## 2015-09-06 NOTE — Telephone Encounter (Signed)
Received a faxed medication request from [facility].  Please advise.  Thank you. 

## 2015-09-13 ENCOUNTER — Encounter: Payer: Self-pay | Admitting: Physician Assistant

## 2015-09-13 ENCOUNTER — Ambulatory Visit: Payer: Self-pay | Admitting: Physician Assistant

## 2015-09-13 VITALS — BP 140/90 | HR 99 | Temp 97.5°F

## 2015-09-13 DIAGNOSIS — J069 Acute upper respiratory infection, unspecified: Secondary | ICD-10-CM

## 2015-09-13 DIAGNOSIS — W57XXXA Bitten or stung by nonvenomous insect and other nonvenomous arthropods, initial encounter: Secondary | ICD-10-CM

## 2015-09-13 MED ORDER — DOXYCYCLINE HYCLATE 100 MG PO TABS: 100.0000 mg | ORAL_TABLET | Freq: Two times a day (BID) | ORAL | Status: DC

## 2015-09-13 NOTE — Progress Notes (Signed)
S: C/o runny nose and congestion for 3 weeks, no fever, chills, cp/sob, v/d; mucus was green this am but clear throughout the day, cough is sporadic, some wheezing and having to use his inhaler, also sugars are running high, had several tick bites, had a fever about a week ago, both kids have the flu; denies body aches or rash  Using otc meds: robitussin  O: PE: vitals wnl, nad, appears well, perrl eomi, normocephalic, tms dull, nasal mucosa red and swollen, throat injected, neck supple no lymph, lungs c t a, cv rrr, neuro intact  A:  Acute viral uri, tick bite   P: doxy 100mg  bid x 10d, drink fluids, continue regular meds , use otc meds of choice, return if not improving in 5 days, return earlier if worsening

## 2015-09-16 ENCOUNTER — Other Ambulatory Visit: Payer: Self-pay

## 2015-09-16 DIAGNOSIS — Z0000 Encounter for general adult medical examination without abnormal findings: Secondary | ICD-10-CM

## 2015-09-16 NOTE — Progress Notes (Signed)
Patient came in to have blood drawn [facility]  Blood was drawn from the right arm without any incident.

## 2015-09-17 LAB — BASIC METABOLIC PANEL
BUN/Creatinine Ratio: 25 — ABNORMAL HIGH (ref 9–20)
BUN: 18 mg/dL (ref 6–20)
CO2: 21 mmol/L (ref 18–29)
Calcium: 10.5 mg/dL — ABNORMAL HIGH (ref 8.7–10.2)
Chloride: 97 mmol/L (ref 96–106)
Creatinine, Ser: 0.71 mg/dL — ABNORMAL LOW (ref 0.76–1.27)
GFR, EST AFRICAN AMERICAN: 144 mL/min/{1.73_m2} (ref >59)
GFR, EST NON AFRICAN AMERICAN: 124 mL/min/{1.73_m2} (ref >59)
Glucose: 153 mg/dL — ABNORMAL HIGH (ref 65–99)
POTASSIUM: 4.9 mmol/L (ref 3.5–5.2)
SODIUM: 142 mmol/L (ref 134–144)

## 2015-09-17 LAB — LIPID PANEL
Chol/HDL Ratio: 6.1 ratio units — ABNORMAL HIGH (ref 0.0–5.0)
Cholesterol, Total: 202 mg/dL — ABNORMAL HIGH (ref 100–199)
HDL: 33 mg/dL — ABNORMAL LOW (ref >39)
TRIGLYCERIDES: 601 mg/dL — AB (ref 0–149)

## 2015-09-17 LAB — HGB A1C W/O EAG: HEMOGLOBIN A1C: 8.2 % — AB (ref 4.8–5.6)

## 2015-09-17 LAB — C-PEPTIDE: C-Peptide: 5.5 ng/mL — ABNORMAL HIGH (ref 1.1–4.4)

## 2015-09-25 ENCOUNTER — Encounter: Payer: Self-pay | Admitting: Emergency Medicine

## 2015-09-25 ENCOUNTER — Emergency Department
Admission: EM | Admit: 2015-09-25 | Discharge: 2015-09-25 | Disposition: A | Discharge status: Home / Self Care | Payer: Managed Care, Other (non HMO) | Attending: Emergency Medicine | Admitting: Emergency Medicine

## 2015-09-25 ENCOUNTER — Emergency Department: Payer: Managed Care, Other (non HMO)

## 2015-09-25 DIAGNOSIS — J189 Pneumonia, unspecified organism: Secondary | ICD-10-CM | POA: Diagnosis not present

## 2015-09-25 DIAGNOSIS — E119 Type 2 diabetes mellitus without complications: Secondary | ICD-10-CM | POA: Insufficient documentation

## 2015-09-25 DIAGNOSIS — Z87891 Personal history of nicotine dependence: Secondary | ICD-10-CM | POA: Insufficient documentation

## 2015-09-25 DIAGNOSIS — Z79899 Other long term (current) drug therapy: Secondary | ICD-10-CM | POA: Diagnosis not present

## 2015-09-25 DIAGNOSIS — Z7984 Long term (current) use of oral hypoglycemic drugs: Secondary | ICD-10-CM | POA: Diagnosis not present

## 2015-09-25 DIAGNOSIS — E785 Hyperlipidemia, unspecified: Secondary | ICD-10-CM | POA: Insufficient documentation

## 2015-09-25 DIAGNOSIS — R05 Cough: Secondary | ICD-10-CM | POA: Diagnosis present

## 2015-09-25 DIAGNOSIS — I10 Essential (primary) hypertension: Secondary | ICD-10-CM | POA: Insufficient documentation

## 2015-09-25 HISTORY — DX: Hyperlipidemia, unspecified: E78.5

## 2015-09-25 HISTORY — DX: Essential (primary) hypertension: I10

## 2015-09-25 HISTORY — DX: Type 2 diabetes mellitus without complications: E11.9

## 2015-09-25 LAB — RAPID INFLUENZA A&B ANTIGENS
Influenza A (ARMC): NEGATIVE
Influenza B (ARMC): NEGATIVE

## 2015-09-25 LAB — CBC WITH DIFFERENTIAL/PLATELET
Basophils Absolute: 0.1 10*3/uL (ref 0–0.1)
Basophils Relative: 1 %
Eosinophils Absolute: 0 10*3/uL (ref 0–0.7)
HEMATOCRIT: 40.4 % (ref 40.0–52.0)
Hemoglobin: 13.4 g/dL (ref 13.0–18.0)
Lymphs Abs: 1.3 10*3/uL (ref 1.0–3.6)
MCH: 27.9 pg (ref 26.0–34.0)
MCHC: 33.2 g/dL (ref 32.0–36.0)
MCV: 84 fL (ref 80.0–100.0)
Monocytes Absolute: 0.9 10*3/uL (ref 0.2–1.0)
Monocytes Relative: 9 %
NEUTROS ABS: 7 10*3/uL — AB (ref 1.4–6.5)
Platelets: 285 10*3/uL (ref 150–440)
RBC: 4.81 MIL/uL (ref 4.40–5.90)
RDW: 13.5 % (ref 11.5–14.5)
WBC: 9.3 10*3/uL (ref 3.8–10.6)

## 2015-09-25 LAB — COMPREHENSIVE METABOLIC PANEL
ALT: 58 U/L (ref 17–63)
ANION GAP: 11 (ref 5–15)
AST: 48 U/L — AB (ref 15–41)
Albumin: 4.9 g/dL (ref 3.5–5.0)
Alkaline Phosphatase: 48 U/L (ref 38–126)
BUN: 16 mg/dL (ref 6–20)
CO2: 25 mmol/L (ref 22–32)
Calcium: 9.7 mg/dL (ref 8.9–10.3)
Chloride: 102 mmol/L (ref 101–111)
Creatinine, Ser: 0.94 mg/dL (ref 0.61–1.24)
GFR calc Af Amer: >60 mL/min (ref >60)
Glucose, Bld: 150 mg/dL — ABNORMAL HIGH (ref 65–99)
POTASSIUM: 4.2 mmol/L (ref 3.5–5.1)
Sodium: 138 mmol/L (ref 135–145)
Total Bilirubin: 0.7 mg/dL (ref 0.3–1.2)
Total Protein: 8.3 g/dL — ABNORMAL HIGH (ref 6.5–8.1)

## 2015-09-25 LAB — POCT RAPID STREP A: STREPTOCOCCUS, GROUP A SCREEN (DIRECT): NEGATIVE

## 2015-09-25 MED ORDER — DEXTROSE 5 % IV SOLN
1.0000 g | Freq: Once | INTRAVENOUS | Status: AC
  Administered 2015-09-25: 1 g via INTRAVENOUS
  Filled 2015-09-25: qty 10

## 2015-09-25 MED ORDER — AZITHROMYCIN 250 MG PO TABS: ORAL_TABLET | ORAL | Status: DC

## 2015-09-25 MED ORDER — PREDNISONE 10 MG PO TABS
10.0000 mg | ORAL_TABLET | ORAL | Status: DC
Start: 2015-09-25 — End: 2015-10-28

## 2015-09-25 MED ORDER — SODIUM CHLORIDE 0.9 % IV BOLUS (SEPSIS)
1000.0000 mL | Freq: Once | INTRAVENOUS | Status: AC
  Administered 2015-09-25: 1000 mL via INTRAVENOUS

## 2015-09-25 MED ORDER — ACETAMINOPHEN 325 MG PO TABS
650.0000 mg | ORAL_TABLET | Freq: Once | ORAL | Status: AC
  Administered 2015-09-25: 650 mg via ORAL
  Filled 2015-09-25: qty 2

## 2015-09-25 NOTE — ED Notes (Signed)
Pt states for the past 3 weeks he has been sick with cough sx and fever for the past 2 days 101-102 yesterday per patient.  Pt states he has been taking some ibuprofen and tylenol.  States was on antibiotics also recently doxycycline and augmentin.  Children at home also sick.

## 2015-09-25 NOTE — ED Provider Notes (Signed)
[facility] Emergency Department Provider Note  ____________________________________________  Time seen: Approximately 3:52 PM  I have reviewed the triage vital signs and the nursing notes.   HISTORY  Chief Complaint Cough and Fever    HPI Dale Mendoza is a 35 y.o. male who presents emergency department complaining of 3 weeks history of coughing and malaise. Patient states that he has been treated with antibiotics twice being placed on both Augmentin and doxycycline. He reports that he does experience some mild improvement while on the antibiotics however symptoms return. He states the last 2 days he has had a fever that has been of 202.53F. Patient has been taking Tylenol and Motrin for same. He reports that his children have also recently been sick and was seen by their pediatrician. He states that they're symptoms are improving but his have continued to worsen. Patient reports having a mild general headache, nasal congestion, cough, rib pain with coughing, and feeling of nausea after coughing spells. He denies any visual acuity changes, neck pain, sore throat, chest pain, abdominal pain, nausea or vomiting.   Past Medical History  Diagnosis Date  . Diabetes mellitus without complication (HCC)   . Hypertension   . Hyperlipidemia     There are no active problems to display for this patient.   History reviewed. No pertinent past surgical history.  Current Outpatient Rx  Name  Route  Sig  Dispense  Refill  . albuterol (PROVENTIL HFA;VENTOLIN HFA) 108 (90 Base) MCG/ACT inhaler   Inhalation   Inhale 1-2 puffs into the lungs every 6 (six) hours as needed for wheezing or shortness of breath.   1 Inhaler   2   . azithromycin (ZITHROMAX Z-PAK) 250 MG tablet      Take 2 tablets (500 mg) on  Day 1,  followed by 1 tablet (250 mg) once daily on Days 2 through 5.   6 each   0   . doxycycline (VIBRA-TABS) 100 MG tablet   Oral   Take 1 tablet (100 mg total) by  mouth 2 (two) times daily.   20 tablet   0     rx written   . fenofibrate (TRICOR) 145 MG tablet   Oral   Take 1 tablet (145 mg total) by mouth daily.   30 tablet   1   . fluticasone (FLONASE) 50 MCG/ACT nasal spray   Each Nare   Place 1 spray into both nostrils 2 (two) times daily.   16 g   1   . glipiZIDE (GLUCOTROL) 5 MG tablet   Oral   Take 1 tablet (5 mg total) by mouth 2 (two) times daily before a meal.   60 tablet   1   . lidocaine (XYLOCAINE) 2 % solution   Mouth/Throat   Use as directed 5 mLs in the mouth or throat every 6 (six) hours as needed for mouth pain. Mix with 5 ml of Bromfed DM for swish and swallow   100 mL   0   . lisinopril (PRINIVIL,ZESTRIL) 10 MG tablet   Oral   Take 1 tablet (10 mg total) by mouth 2 (two) times daily.   60 tablet   1   . loratadine (CLARITIN) 10 MG tablet   Oral   Take 1 tablet (10 mg total) by mouth daily.   30 tablet   11   . metformin (FORTAMET) 1000 MG (OSM) 24 hr tablet   Oral   Take 1 tablet (1,000 mg total)  by mouth 2 (two) times daily with a meal.   60 tablet   2   . predniSONE (DELTASONE) 10 MG tablet   Oral   Take 1 tablet (10 mg total) by mouth as directed.   21 tablet   0     Take on a daily basis of 6, 5, 4, 3, 2, 1   . ranitidine (ZANTAC) 150 MG tablet   Oral   Take 1 tablet (150 mg total) by mouth 2 (two) times daily.   60 tablet   6     Allergies Review of patient's allergies indicates no known allergies.  History reviewed. No pertinent family history.  Social History Social History  Substance Use Topics  . Smoking status: Former Games developer  . Smokeless tobacco: None  . Alcohol Use: 0.0 oz/week    0 Standard drinks or equivalent per week     Review of Systems  Constitutional: Positive fever/chills Eyes: No visual changes. No discharge ENT: Positive for nasal congestion. Denies sore throat. Cardiovascular: no chest pain. Respiratory: Positive cough. No SOB. Gastrointestinal: No  abdominal pain.  No nausea, no vomiting.  No diarrhea.  No constipation. Musculoskeletal: Positive for bilateral rib pain with coughing episodes. Skin: Negative for rash, abrasions, lacerations, ecchymosis. Neurological: Positive for headache but denies focal weakness or numbness. 10-point ROS otherwise negative.  ____________________________________________   PHYSICAL EXAM:  VITAL SIGNS: ED Triage Vitals  Enc Vitals Group     BP 09/25/15 1427 155/82 mmHg     Pulse Rate 09/25/15 1427 122     Resp 09/25/15 1427 20     Temp 09/25/15 1427 98.9 F (37.2 C)     Temp Source 09/25/15 1427 Oral     SpO2 09/25/15 1427 96 %     Weight 09/25/15 1427 245 lb (111.131 kg)     Height 09/25/15 1427  (1.803 m)     Head Cir --      Peak Flow --      Pain Score 09/25/15 1433 8     Pain Loc --      Pain Edu? --      Excl. in GC? --      Constitutional: Alert and oriented. Well appearing and in no acute distress. Eyes: Conjunctivae are normal. PERRL. EOMI. Head: Atraumatic. ENT:      Ears: EACs are unremarkable bilaterally. TMs are mildly bulging bilaterally.      Nose: Moderate clear congestion/rhinnorhea. Turbinates are erythematous and mildly edematous.      Mouth/Throat: Mucous membranes are moist. Oropharynx nonerythematous and nonedematous. Tonsils are nonerythematous and nonedematous with no exudates. Neck: No stridor.  No cervical spine tenderness to palpation. Neck is supple with full range of motion. Hematological/Lymphatic/Immunilogical: No cervical lymphadenopathy. Cardiovascular: Normal rate, regular rhythm. Normal S1 and S2.  Good peripheral circulation. Respiratory: Normal respiratory effort without tachypnea or retractions. Lungs with mild scattered expiratory wheezing.Peri Jefferson air entry to the bases with no decreased or absent breath sounds. Musculoskeletal: Full range of motion to all extremities. No gross deformities appreciated. Neurologic:  Normal speech and language.  No gross focal neurologic deficits are appreciated.  Skin:  Skin is warm, dry and intact. No rash noted. Psychiatric: Mood and affect are normal. Speech and behavior are normal. Patient exhibits appropriate insight and judgement.   ____________________________________________   LABS (all labs ordered are listed, but only abnormal results are displayed)  Labs Reviewed  COMPREHENSIVE METABOLIC PANEL - Abnormal; Notable for the following:    Glucose,  Bld 150 (*)    Total Protein 8.3 (*)    AST 48 (*)    All other components within normal limits  CBC WITH DIFFERENTIAL/PLATELET - Abnormal; Notable for the following:    Neutro Abs 7.0 (*)    All other components within normal limits  RAPID INFLUENZA A&B ANTIGENS ([facility] ONLY)  POCT RAPID STREP A   ____________________________________________  EKG   ____________________________________________  RADIOLOGY Festus BarrenI, Tationa Stech D Rossi Silvestro, personally viewed and evaluated these images (plain radiographs) as part of my medical decision making, as well as reviewing the written report by the radiologist.  Dg Chest 2 View  09/25/2015  CLINICAL DATA:  Cough for 3 weeks, fever for 48 hours, diabetes mellitus, hypertension, has been on 2 different antibiotics EXAM: CHEST  2 VIEW COMPARISON:  None FINDINGS: Normal heart size, mediastinal contours and pulmonary vascularity. Question hazy infiltrate in lingula. Mild central peribronchial thickening. Remaining lungs clear. No pleural effusion or pneumothorax. Bones unremarkable. IMPRESSION: Minimal bronchitic changes with suspicion of hazy lingular infiltrate. Electronically Signed   By: Ulyses SouthwardMark  Boles M.D.   On: 09/25/2015 16:28    ____________________________________________    PROCEDURES  Procedure(s) performed:       Medications  cefTRIAXone (ROCEPHIN) 1 g in dextrose 5 % 50 mL IVPB (not administered)  sodium chloride 0.9 % bolus 1,000 mL (1,000 mLs Intravenous New Bag/Given 09/25/15 1643)   acetaminophen (TYLENOL) tablet 650 mg (650 mg Oral Given 09/25/15 1635)     ____________________________________________   INITIAL IMPRESSION / ASSESSMENT AND PLAN / ED COURSE  Pertinent labs & imaging results that were available during my care of the patient were reviewed by me and considered in my medical decision making (see chart for details).  Patient's diagnosis is consistent with Community-acquired pneumonia. Patient's x-ray reveals a hazy infiltrate in the left lung field. No lobar consolidation. Patient is given IV Rocephin here in the emergency department he'll be discharged home with antibiotics. Strict follow-up precautions with his primary care or the emergency department are given for any worsening of symptoms.  Patient will be discharged home with prescriptions for antibiotics and steroids, patient already has an albuterol inhaler at home and is encouraged to use same.. Patient is to follow up with primary care as needed or otherwise directed.   ____________________________________________  FINAL CLINICAL IMPRESSION(S) / ED DIAGNOSES  Final diagnoses:  Community acquired pneumonia      NEW MEDICATIONS STARTED DURING THIS VISIT:  New Prescriptions   AZITHROMYCIN (ZITHROMAX Z-PAK) 250 MG TABLET    Take 2 tablets (500 mg) on  Day 1,  followed by 1 tablet (250 mg) once daily on Days 2 through 5.   PREDNISONE (DELTASONE) 10 MG TABLET    Take 1 tablet (10 mg total) by mouth as directed.        This chart was dictated using voice recognition software/Dragon. Despite best efforts to proofread, errors can occur which can change the meaning. Any change was purely unintentional.    Racheal PatchesJonathan D Redonna Wilbert, PA-C 09/25/15 1833  Emily FilbertJonathan E Williams, MD 09/25/15 2029

## 2015-09-25 NOTE — Discharge Instructions (Signed)

## 2015-09-25 NOTE — ED Notes (Signed)
Patient appears to be sleeping at this time.  Fluids are continuing to infuse and respirations are even and non labored.

## 2015-10-04 ENCOUNTER — Other Ambulatory Visit: Payer: Self-pay | Admitting: Emergency Medicine

## 2015-10-04 ENCOUNTER — Ambulatory Visit: Payer: Self-pay | Admitting: Physician Assistant

## 2015-10-04 ENCOUNTER — Encounter: Payer: Self-pay | Admitting: Physician Assistant

## 2015-10-04 VITALS — BP 140/80 | HR 100 | Temp 98.5°F

## 2015-10-04 DIAGNOSIS — H6500 Acute serous otitis media, unspecified ear: Secondary | ICD-10-CM

## 2015-10-04 MED ORDER — CEFDINIR 300 MG PO CAPS: 300.0000 mg | ORAL_CAPSULE | Freq: Two times a day (BID) | ORAL | Status: DC

## 2015-10-04 MED ORDER — ALBUTEROL SULFATE HFA 108 (90 BASE) MCG/ACT IN AERS: 1.0000 | INHALATION_SPRAY | Freq: Four times a day (QID) | RESPIRATORY_TRACT | Status: DC | PRN

## 2015-10-04 MED ORDER — LISINOPRIL 10 MG PO TABS: 10.0000 mg | ORAL_TABLET | Freq: Two times a day (BID) | ORAL | Status: DC

## 2015-10-04 MED ORDER — FLUTICASONE PROPIONATE 50 MCG/ACT NA SUSP: 1.0000 | Freq: Two times a day (BID) | NASAL | Status: AC

## 2015-10-04 NOTE — Telephone Encounter (Signed)
Med refill approved 

## 2015-10-04 NOTE — Progress Notes (Signed)
S: c/o r ear pain, pressure and a little dizzy, can't hear as well as normal, no fever/chills, recently treated for pneumonia with zpack and prednisone, children both had pneumonia and ear infections, denies fever, chills, cp/sob  O: vitals wnl, nad, tm on r is swollen and red, dull, nasal mucosa inflamed, throat wnl, neck supple no lymph, lungs c t a, cv rrr  A: acute otitis media  P: omnicef, flonase, refill on albuterol inhaler

## 2015-10-28 ENCOUNTER — Ambulatory Visit: Payer: Self-pay | Admitting: Physician Assistant

## 2015-10-28 ENCOUNTER — Encounter: Payer: Self-pay | Admitting: Physician Assistant

## 2015-10-28 VITALS — BP 150/80 | HR 112 | Temp 98.3°F

## 2015-10-28 DIAGNOSIS — J0181 Other acute recurrent sinusitis: Secondary | ICD-10-CM

## 2015-10-28 MED ORDER — LEVOFLOXACIN 500 MG PO TABS: 500.0000 mg | ORAL_TABLET | Freq: Every day | ORAL | Status: DC

## 2015-10-28 NOTE — Progress Notes (Signed)
S: c/o recurrent sinusitis, this is the third time this spring. Is getting a lot of green mucus out all day, recent antibiotics were zpack and omnicef, states he will feel better for a few days then the sx will return, pt has metabolic syndrome, recently started trulicity injections, denies fever/chills/cough  O: vitals wnl, nad, tms clear, nasal mucosa boggy on r, red and swollen on left, throat wnl, neck supple no lymph, lungs c t a, cv rrr  A: acute recurrent sinusitis  P: levaquin, refer to ENT

## 2015-10-29 NOTE — Progress Notes (Signed)
Patient ID: Dale Mendoza, male   DOB: 02/18/1981, 35 y.o.   MRN: [id number] Patient has been referred to [facility].  Appointment is 11/12/15 at 9:15am.  I called and left a message on patient's voicemail.

## 2015-11-08 ENCOUNTER — Other Ambulatory Visit: Payer: Self-pay | Admitting: Physician Assistant

## 2015-11-08 NOTE — Telephone Encounter (Signed)
Med refill approved 

## 2016-01-18 ENCOUNTER — Other Ambulatory Visit: Payer: Self-pay | Admitting: Physician Assistant

## 2016-01-18 NOTE — Telephone Encounter (Signed)
Med refill approved, pt needs to come into clinic for A1C

## 2016-02-21 ENCOUNTER — Other Ambulatory Visit: Payer: Self-pay | Admitting: Physician Assistant

## 2016-02-21 NOTE — Telephone Encounter (Signed)
Refill lisinopril for patient, still advise he get a pcp and see endocrinology, rma to call patient and schedule him with a pcp

## 2016-04-28 ENCOUNTER — Other Ambulatory Visit: Payer: Self-pay | Admitting: Physician Assistant

## 2016-04-28 NOTE — Telephone Encounter (Signed)
Med refill approved, pt has hx of dm and htn, being seen by pcp

## 2016-06-05 ENCOUNTER — Emergency Department
Admission: EM | Admit: 2016-06-05 | Discharge: 2016-06-06 | Disposition: A | Discharge status: Home / Self Care | Payer: Managed Care, Other (non HMO) | Attending: Emergency Medicine | Admitting: Emergency Medicine

## 2016-06-05 ENCOUNTER — Emergency Department: Payer: Managed Care, Other (non HMO)

## 2016-06-05 ENCOUNTER — Encounter: Payer: Self-pay | Admitting: Emergency Medicine

## 2016-06-05 DIAGNOSIS — I10 Essential (primary) hypertension: Secondary | ICD-10-CM | POA: Insufficient documentation

## 2016-06-05 DIAGNOSIS — Z7984 Long term (current) use of oral hypoglycemic drugs: Secondary | ICD-10-CM | POA: Diagnosis not present

## 2016-06-05 DIAGNOSIS — E119 Type 2 diabetes mellitus without complications: Secondary | ICD-10-CM | POA: Diagnosis not present

## 2016-06-05 DIAGNOSIS — R1010 Upper abdominal pain, unspecified: Secondary | ICD-10-CM | POA: Diagnosis present

## 2016-06-05 DIAGNOSIS — Z87891 Personal history of nicotine dependence: Secondary | ICD-10-CM | POA: Diagnosis not present

## 2016-06-05 DIAGNOSIS — K859 Acute pancreatitis without necrosis or infection, unspecified: Secondary | ICD-10-CM

## 2016-06-05 DIAGNOSIS — Z79899 Other long term (current) drug therapy: Secondary | ICD-10-CM | POA: Diagnosis not present

## 2016-06-05 HISTORY — DX: Acute pancreatitis without necrosis or infection, unspecified: K85.90

## 2016-06-05 LAB — URINALYSIS, COMPLETE (UACMP) WITH MICROSCOPIC
BILIRUBIN URINE: NEGATIVE
Bacteria, UA: NONE SEEN
HGB URINE DIPSTICK: NEGATIVE
Ketones, ur: NEGATIVE mg/dL
LEUKOCYTES UA: NEGATIVE
NITRITE: NEGATIVE
PH: 7 (ref 5.0–8.0)
Protein, ur: 100 mg/dL — AB
Specific Gravity, Urine: 1.022 (ref 1.005–1.030)
Squamous Epithelial / LPF: NONE SEEN

## 2016-06-05 LAB — LIPASE, BLOOD: Lipase: 62 U/L — ABNORMAL HIGH (ref 11–51)

## 2016-06-05 LAB — COMPREHENSIVE METABOLIC PANEL
ALT: 44 U/L (ref 17–63)
AST: 41 U/L (ref 15–41)
Albumin: 4.6 g/dL (ref 3.5–5.0)
Alkaline Phosphatase: 55 U/L (ref 38–126)
Anion gap: 11 (ref 5–15)
BILIRUBIN TOTAL: 0.9 mg/dL (ref 0.3–1.2)
BUN: 15 mg/dL (ref 6–20)
CO2: 27 mmol/L (ref 22–32)
CREATININE: 0.83 mg/dL (ref 0.61–1.24)
Calcium: 10.1 mg/dL (ref 8.9–10.3)
Chloride: 99 mmol/L — ABNORMAL LOW (ref 101–111)
Glucose, Bld: 239 mg/dL — ABNORMAL HIGH (ref 65–99)
POTASSIUM: 4.1 mmol/L (ref 3.5–5.1)
Sodium: 137 mmol/L (ref 135–145)
TOTAL PROTEIN: 7.7 g/dL (ref 6.5–8.1)

## 2016-06-05 LAB — CBC
HCT: 41.9 % (ref 40.0–52.0)
Hemoglobin: 14.3 g/dL (ref 13.0–18.0)
MCH: 28.1 pg (ref 26.0–34.0)
MCHC: 34.2 g/dL (ref 32.0–36.0)
MCV: 82.2 fL (ref 80.0–100.0)
PLATELETS: 344 10*3/uL (ref 150–440)
RBC: 5.1 MIL/uL (ref 4.40–5.90)
RDW: 13.3 % (ref 11.5–14.5)
WBC: 14.5 10*3/uL — ABNORMAL HIGH (ref 3.8–10.6)

## 2016-06-05 LAB — TROPONIN I: Troponin I: <0.03 ng/mL (ref <0.03)

## 2016-06-05 MED ORDER — SUCRALFATE 1 G PO TABS
1.0000 g | ORAL_TABLET | Freq: Once | ORAL | Status: AC
  Administered 2016-06-05: 1 g via ORAL
  Filled 2016-06-05: qty 1

## 2016-06-05 MED ORDER — GI COCKTAIL ~~LOC~~
30.0000 mL | Freq: Once | ORAL | Status: AC
  Administered 2016-06-05: 30 mL via ORAL
  Filled 2016-06-05: qty 30

## 2016-06-05 MED ORDER — ONDANSETRON HCL 4 MG/2ML IJ SOLN
4.0000 mg | Freq: Once | INTRAMUSCULAR | Status: AC
  Administered 2016-06-05: 4 mg via INTRAVENOUS
  Filled 2016-06-05: qty 2

## 2016-06-05 MED ORDER — SODIUM CHLORIDE 0.9 % IV BOLUS (SEPSIS)
1000.0000 mL | Freq: Once | INTRAVENOUS | Status: AC
  Administered 2016-06-05: 1000 mL via INTRAVENOUS

## 2016-06-05 NOTE — ED Triage Notes (Signed)
Pt ambulatory to triage with steady gait with c/o epigastric pain accompanied by nausea since this morning. Pt reports pain has been increasing through the night. Pt reports hx of pancreatitis. Pt denies vomiting, diarrhea, or shortness of breath. Pt denies urinary symptoms. Pt alert and oriented x 4, skin warm and dry, respirations even and unlabored.

## 2016-06-06 LAB — TROPONIN I

## 2016-06-06 MED ORDER — OXYCODONE-ACETAMINOPHEN 5-325 MG PO TABS: 1.0000 | ORAL_TABLET | Freq: Four times a day (QID) | ORAL | 0 refills | Status: DC | PRN

## 2016-06-06 MED ORDER — KETOROLAC TROMETHAMINE 30 MG/ML IJ SOLN
30.0000 mg | Freq: Once | INTRAMUSCULAR | Status: AC
  Administered 2016-06-06: 30 mg via INTRAVENOUS

## 2016-06-06 MED ORDER — SUCRALFATE 1 G PO TABS: 1.0000 g | ORAL_TABLET | Freq: Two times a day (BID) | ORAL | 0 refills | Status: DC

## 2016-06-06 MED ORDER — ONDANSETRON 4 MG PO TBDP: 4.0000 mg | ORAL_TABLET | Freq: Three times a day (TID) | ORAL | 0 refills | Status: DC | PRN

## 2016-06-06 MED ORDER — KETOROLAC TROMETHAMINE 30 MG/ML IJ SOLN
INTRAMUSCULAR | Status: AC
  Administered 2016-06-06: 30 mg via INTRAVENOUS
  Filled 2016-06-06: qty 1

## 2016-06-06 NOTE — ED Notes (Signed)
Pt reports no change in pain, MD notified.

## 2016-06-06 NOTE — ED Provider Notes (Signed)
[facility] Emergency Department Provider Note   ____________________________________________   First MD Initiated Contact with Patient 06/05/16 2317     (approximate)  I have reviewed the triage vital signs and the nursing notes.   HISTORY  Chief Complaint Abdominal Pain    HPI Dale Mendoza is a 36 y.o. male who comes into the hospital today with some abdominal pain. He reports he woke up this morning with some pain across his upper abdomen lower chest. He initially thought it was indigestion and took some medicine for indigestion at home but he reports it didn't help. If been getting worse and worse throughout the day. He reports that he's been nauseous so he didn't take any of his diabetes medications as he did not want to make himself sick. He has dry heaved a few times. The patient has felt cold and hot flashes and some sweats. He denies any shortness of breath. He reports that now the pain seems more in his left upper quadrant but has no radiation. He does have a history of pancreatitis. The patient rates his pain 8-9 out of 10 in intensity. He denies any recent drinking activities. He is here for evaluation.   Past Medical History:  Diagnosis Date  . Diabetes mellitus without complication (HCC)   . Hyperlipidemia   . Hypertension   . Pancreatitis     There are no active problems to display for this patient.   Past Surgical History:  Procedure Laterality Date  . HERNIA REPAIR    . TONSILLECTOMY      Prior to Admission medications   Medication Sig Start Date End Date Taking? Authorizing Provider  albuterol (PROVENTIL HFA;VENTOLIN HFA) 108 (90 Base) MCG/ACT inhaler Inhale 1-2 puffs into the lungs every 6 (six) hours as needed for wheezing or shortness of breath. 10/04/15   Faythe Ghee, PA-C  Dulaglutide (TRULICITY) 0.75 MG/0.5ML SOPN Inject into the skin. 09/16/15   Historical Provider, MD  fenofibrate (TRICOR) 145 MG tablet TAKE 1 TABLET  EVERY DAY 11/08/15   Faythe Ghee, PA-C  fluticasone [facility]) 50 MCG/ACT nasal spray Place 1 spray into both nostrils 2 (two) times daily. 10/04/15   Faythe Ghee, PA-C  levofloxacin (LEVAQUIN) 500 MG tablet Take 1 tablet (500 mg total) by mouth daily. 10/28/15   Faythe Ghee, PA-C  lisinopril (PRINIVIL,ZESTRIL) 10 MG tablet TAKE 1 TABLET BY MOUTH TWICE DAILY 04/28/16   Faythe Ghee, PA-C  loratadine (CLARITIN) 10 MG tablet Take 1 tablet (10 mg total) by mouth daily. 07/21/15   Barbaraann Barthel, NP  metformin (FORTAMET) 1000 MG (OSM) 24 hr tablet Take 1 tablet (1,000 mg total) by mouth 2 (two) times daily with a meal. 08/27/15   Faythe Ghee, PA-C  metFORMIN (GLUCOPHAGE-XR) 500 MG 24 hr tablet TAKE TWO TABLETS TWICE A DAY 01/18/16   Faythe Ghee, PA-C  ondansetron (ZOFRAN ODT) 4 MG disintegrating tablet Take 1 tablet (4 mg total) by mouth every 8 (eight) hours as needed for nausea or vomiting. 06/06/16   Rebecka Apley, MD  oxyCODONE-acetaminophen (ROXICET) 5-325 MG tablet Take 1 tablet by mouth every 6 (six) hours as needed. 06/06/16   Rebecka Apley, MD  ranitidine (ZANTAC) 150 MG tablet Take 1 tablet (150 mg total) by mouth 2 (two) times daily. 06/16/15   Faythe Ghee, PA-C  sucralfate (CARAFATE) 1 g tablet Take 1 tablet (1 g total) by mouth 2 (two) times daily. 06/06/16  Rebecka ApleyAllison P Webster, MD    Allergies Patient has no known allergies.  No family history on file.  Social History Social History  Substance Use Topics  . Smoking status: Former Games developermoker  . Smokeless tobacco: Never Used  . Alcohol use 0.0 oz/week    Review of Systems Constitutional: No fever/chills Eyes: No visual changes. ENT: No sore throat. Cardiovascular: chest pain. Respiratory: Denies shortness of breath. Gastrointestinal: abdominal pain,  nausea, no vomiting.  No diarrhea.  No constipation. Genitourinary: Negative for dysuria. Musculoskeletal: Negative for back pain. Skin: Negative for  rash. Neurological: Negative for headaches, focal weakness or numbness.  10-point ROS otherwise negative.  ____________________________________________   PHYSICAL EXAM:  VITAL SIGNS: ED Triage Vitals  Enc Vitals Group     BP 06/05/16 2205 (!) 147/85     Pulse Rate 06/05/16 2205 (!) 105     Resp 06/05/16 2205 20     Temp 06/05/16 2205 98.2 F (36.8 C)     Temp Source 06/05/16 2205 Oral     SpO2 06/05/16 2205 100 %     Weight 06/05/16 2206 245 lb (111.1 kg)     Height 06/05/16 2206 6' (1.829 m)     Head Circumference --      Peak Flow --      Pain Score 06/05/16 2206 8     Pain Loc --      Pain Edu? --      Excl. in GC? --     Constitutional: Alert and oriented. Well appearing and in Moderate distress. Eyes: Conjunctivae are normal. PERRL. EOMI. Head: Atraumatic. Nose: No congestion/rhinnorhea. Mouth/Throat: Mucous membranes are moist.  Oropharynx non-erythematous. Cardiovascular: Normal rate, regular rhythm. Grossly normal heart sounds.  Good peripheral circulation. Respiratory: Normal respiratory effort.  No retractions. Lungs CTAB. Gastrointestinal: Soft with some epigastric and left upper quadrant tenderness to palpation. No distention. Positive bowel sounds Musculoskeletal: No lower extremity tenderness nor edema.   Neurologic:  Normal speech and language.  Skin:  Skin is warm, dry and intact.  Psychiatric: Mood and affect are normal.   ____________________________________________   LABS (all labs ordered are listed, but only abnormal results are displayed)  Labs Reviewed  LIPASE, BLOOD - Abnormal; Notable for the following:       Result Value   Lipase 62 (*)    All other components within normal limits  COMPREHENSIVE METABOLIC PANEL - Abnormal; Notable for the following:    Chloride 99 (*)    Glucose, Bld 239 (*)    All other components within normal limits  CBC - Abnormal; Notable for the following:    WBC 14.5 (*)    All other components within normal  limits  URINALYSIS, COMPLETE (UACMP) WITH MICROSCOPIC - Abnormal; Notable for the following:    Color, Urine YELLOW (*)    APPearance CLEAR (*)    Glucose, UA >=500 (*)    Protein, ur 100 (*)    All other components within normal limits  TROPONIN I  TROPONIN I   ____________________________________________  EKG  ED ECG REPORT I, Rebecka ApleyWebster,  Allison P, the attending physician, personally viewed and interpreted this ECG.   Date: 06/05/2016  EKG Time: 2210  Rate: 110  Rhythm: sinus tachycardia  Axis: normal  Intervals:none  ST&T Change: none  ____________________________________________  RADIOLOGY  CXR ____________________________________________   PROCEDURES  Procedure(s) performed: None  Procedures  Critical Care performed: No  ____________________________________________   INITIAL IMPRESSION / ASSESSMENT AND PLAN / ED COURSE  Pertinent  labs & imaging results that were available during my care of the patient were reviewed by me and considered in my medical decision making (see chart for details).  This is a 36 year old male who comes into the hospital today with some epigastric and left upper quadrant abdominal pain. The patient does have a history of pancreatitis. We check some blood work and his lipase is slightly elevated. The patient's blood sugar is also high. He did drive himself so at this point I cannot give him narcotics but I will give him a GI cocktail as well as some Carafate. I will also give the patient some Zofran and a liter of normal saline.  The patient was still having some pain so I did give him a shot of Toradol. I will reassess the patient after he has received his medications and I will also repeat his troponin as well.  Clinical Course as of Jun 06 246  Tue Jun 06, 2016  0045 No active cardiopulmonary disease. DG Chest 2 View [AW]    Clinical Course User Index [AW] Rebecka Apley, MD    The patient's pain is improved. He will be  discharged home to follow-up with his primary care physician. Instructed him that should he have any vomiting or he can keep down his fluids or his medications he should return to emergency department. ____________________________________________   FINAL CLINICAL IMPRESSION(S) / ED DIAGNOSES  Final diagnoses:  Acute pancreatitis without infection or necrosis, unspecified pancreatitis type      NEW MEDICATIONS STARTED DURING THIS VISIT:  New Prescriptions   ONDANSETRON (ZOFRAN ODT) 4 MG DISINTEGRATING TABLET    Take 1 tablet (4 mg total) by mouth every 8 (eight) hours as needed for nausea or vomiting.   OXYCODONE-ACETAMINOPHEN (ROXICET) 5-325 MG TABLET    Take 1 tablet by mouth every 6 (six) hours as needed.   SUCRALFATE (CARAFATE) 1 G TABLET    Take 1 tablet (1 g total) by mouth 2 (two) times daily.     Note:  This document was prepared using Dragon voice recognition software and may include unintentional dictation errors.    Rebecka Apley, MD 06/06/16 [phone]

## 2016-06-12 ENCOUNTER — Other Ambulatory Visit: Payer: Self-pay | Admitting: Physician Assistant

## 2016-06-14 NOTE — Telephone Encounter (Signed)
Med refill for metformin approved

## 2016-07-17 ENCOUNTER — Other Ambulatory Visit: Payer: Self-pay | Admitting: Physician Assistant

## 2016-07-17 DIAGNOSIS — K219 Gastro-esophageal reflux disease without esophagitis: Secondary | ICD-10-CM

## 2016-07-17 NOTE — Telephone Encounter (Signed)
Med refill approved, pt will need fasting lipids prior to additional refills

## 2016-07-21 ENCOUNTER — Ambulatory Visit: Payer: Self-pay | Admitting: Physician Assistant

## 2016-07-21 ENCOUNTER — Encounter: Payer: Self-pay | Admitting: Physician Assistant

## 2016-07-21 VITALS — BP 140/89 | HR 94 | Temp 97.5°F

## 2016-07-21 DIAGNOSIS — J069 Acute upper respiratory infection, unspecified: Secondary | ICD-10-CM

## 2016-07-21 MED ORDER — PSEUDOEPH-BROMPHEN-DM 30-2-10 MG/5ML PO SYRP: 5.0000 mL | ORAL_SOLUTION | Freq: Four times a day (QID) | ORAL | 0 refills | Status: DC | PRN

## 2016-07-21 NOTE — Progress Notes (Signed)
   Subjective:cough    Patient ID: Dale Mendoza, male    DOB: [birth year], 36 y.o.   MRN: [id number]  HPI Patient c/o productive cough for one week. States mild improvement using OTC cough medications. Also c/o nasal congestion and post nasal drainage. Re-start Flonase last week due to seasonal allergies.   Review of Systems    DM and HTN Objective:   Physical Exam Bilateral edematous nasal turbinates with clear rhinorrhea. Post nasal drainage. Neck supple w/o adenopathy. Lungs CTA and Heart RRR.      Assessment & Plan:URI  Continue Flonase and start Bromfed DM as directed.

## 2016-09-25 ENCOUNTER — Other Ambulatory Visit: Payer: Self-pay | Admitting: Physician Assistant

## 2016-09-26 NOTE — Telephone Encounter (Signed)
Med refill for genofibrate approved.  Pt will need fasting labs in July

## 2016-12-02 ENCOUNTER — Encounter: Payer: Self-pay | Admitting: Emergency Medicine

## 2016-12-02 ENCOUNTER — Emergency Department: Payer: Managed Care, Other (non HMO)

## 2016-12-02 ENCOUNTER — Emergency Department
Admission: EM | Admit: 2016-12-02 | Discharge: 2016-12-03 | Disposition: A | Discharge status: Home / Self Care | Payer: Managed Care, Other (non HMO) | Attending: Emergency Medicine | Admitting: Emergency Medicine

## 2016-12-02 DIAGNOSIS — Z79899 Other long term (current) drug therapy: Secondary | ICD-10-CM | POA: Diagnosis not present

## 2016-12-02 DIAGNOSIS — R079 Chest pain, unspecified: Secondary | ICD-10-CM | POA: Diagnosis present

## 2016-12-02 DIAGNOSIS — Z7984 Long term (current) use of oral hypoglycemic drugs: Secondary | ICD-10-CM | POA: Insufficient documentation

## 2016-12-02 DIAGNOSIS — E119 Type 2 diabetes mellitus without complications: Secondary | ICD-10-CM | POA: Diagnosis not present

## 2016-12-02 DIAGNOSIS — Z87891 Personal history of nicotine dependence: Secondary | ICD-10-CM | POA: Insufficient documentation

## 2016-12-02 DIAGNOSIS — R1013 Epigastric pain: Secondary | ICD-10-CM | POA: Diagnosis not present

## 2016-12-02 DIAGNOSIS — I10 Essential (primary) hypertension: Secondary | ICD-10-CM | POA: Insufficient documentation

## 2016-12-02 LAB — CBC
HCT: 45.4 % (ref 40.0–52.0)
Hemoglobin: 15.6 g/dL (ref 13.0–18.0)
MCH: 28.4 pg (ref 26.0–34.0)
MCHC: 34.4 g/dL (ref 32.0–36.0)
MCV: 82.4 fL (ref 80.0–100.0)
PLATELETS: 350 10*3/uL (ref 150–440)
RBC: 5.51 MIL/uL (ref 4.40–5.90)
RDW: 13.3 % (ref 11.5–14.5)
WBC: 17.8 10*3/uL — ABNORMAL HIGH (ref 3.8–10.6)

## 2016-12-02 LAB — HEPATIC FUNCTION PANEL
ALBUMIN: 4.9 g/dL (ref 3.5–5.0)
ALK PHOS: 79 U/L (ref 38–126)
ALT: 41 U/L (ref 17–63)
AST: 39 U/L (ref 15–41)
BILIRUBIN TOTAL: 1.1 mg/dL (ref 0.3–1.2)
Bilirubin, Direct: <0.1 mg/dL — ABNORMAL LOW (ref 0.1–0.5)
Total Protein: 8.1 g/dL (ref 6.5–8.1)

## 2016-12-02 LAB — TROPONIN I: Troponin I: <0.03 ng/mL (ref <0.03)

## 2016-12-02 LAB — BASIC METABOLIC PANEL
Anion gap: 15 (ref 5–15)
BUN: 13 mg/dL (ref 6–20)
CO2: 24 mmol/L (ref 22–32)
CREATININE: 1.03 mg/dL (ref 0.61–1.24)
Calcium: 10.4 mg/dL — ABNORMAL HIGH (ref 8.9–10.3)
Chloride: 96 mmol/L — ABNORMAL LOW (ref 101–111)
GFR calc Af Amer: >60 mL/min (ref >60)
GFR calc non Af Amer: >60 mL/min (ref >60)
GLUCOSE: 263 mg/dL — AB (ref 65–99)
Potassium: 4.4 mmol/L (ref 3.5–5.1)
Sodium: 135 mmol/L (ref 135–145)

## 2016-12-02 LAB — LIPASE, BLOOD: Lipase: 88 U/L — ABNORMAL HIGH (ref 11–51)

## 2016-12-02 MED ORDER — ONDANSETRON HCL 4 MG/2ML IJ SOLN
4.0000 mg | Freq: Once | INTRAMUSCULAR | Status: AC
  Administered 2016-12-02: 4 mg via INTRAVENOUS
  Filled 2016-12-02: qty 2

## 2016-12-02 MED ORDER — GI COCKTAIL ~~LOC~~
ORAL | Status: AC
  Filled 2016-12-02: qty 30

## 2016-12-02 MED ORDER — FAMOTIDINE 40 MG PO TABS: 40.0000 mg | ORAL_TABLET | Freq: Every evening | ORAL | 1 refills | Status: DC

## 2016-12-02 MED ORDER — SUCRALFATE 1 G PO TABS: 1.0000 g | ORAL_TABLET | Freq: Four times a day (QID) | ORAL | 0 refills | Status: DC

## 2016-12-02 MED ORDER — GI COCKTAIL ~~LOC~~
30.0000 mL | Freq: Once | ORAL | Status: AC
  Administered 2016-12-02: 30 mL via ORAL

## 2016-12-02 MED ORDER — SODIUM CHLORIDE 0.9 % IV BOLUS (SEPSIS)
1000.0000 mL | Freq: Once | INTRAVENOUS | Status: AC
  Administered 2016-12-02: 1000 mL via INTRAVENOUS

## 2016-12-02 MED ORDER — MORPHINE SULFATE (PF) 4 MG/ML IV SOLN
4.0000 mg | Freq: Once | INTRAVENOUS | Status: AC
Start: 2016-12-02 — End: 2016-12-02
  Administered 2016-12-02: 4 mg via INTRAVENOUS
  Filled 2016-12-02: qty 1

## 2016-12-02 NOTE — ED Triage Notes (Signed)
Pt arrived via POV from home with reports of mid-chest pain that started about 1230pm today. Pt thought it was indigestion and took antacids, but did not have relief.  Pt describes the pain as stabbing. + nausea

## 2016-12-02 NOTE — Discharge Instructions (Signed)
Please seek medical attention for any high fevers, chest pain, shortness of breath, change in behavior, persistent vomiting, bloody stool or any other new or concerning symptoms.  

## 2016-12-02 NOTE — ED Notes (Signed)
Patient with complaint of pain 8 out of 10. Dr. Derrill KayGoodman notified. New orders received for GI Cocktail.

## 2016-12-02 NOTE — ED Notes (Signed)
Called Lab and spoke with Karena Addisonrene to add on lipase blood draw to blood already in the lab

## 2016-12-02 NOTE — ED Provider Notes (Signed)
[facility] Emergency Department Provider Note   ____________________________________________   I have reviewed the triage vital signs and the nursing notes.   HISTORY  Chief Complaint Chest Pain   History limited by: Not Limited   HPI Dale Mendoza is a 36 y.o. male who presents to the emergency department today because of concerns for chest pain. The pain started roughly 9 hours prior to my evaluation. He describes as being sharp. It is necessarily located in the central chest. It has now radiated down below his bilateral ribs. Has been accompanied by some nausea and vomiting. The vomiting has been nonbloody. The patient states that he had a history of gallbladder disease many years ago. No fevers. No change in defecation.   Past Medical History:  Diagnosis Date  . Diabetes mellitus without complication (HCC)   . Hyperlipidemia   . Hypertension   . Pancreatitis     There are no active problems to display for this patient.   Past Surgical History:  Procedure Laterality Date  . HERNIA REPAIR    . TONSILLECTOMY      Prior to Admission medications   Medication Sig Start Date End Date Taking? Authorizing Provider  albuterol (PROVENTIL HFA;VENTOLIN HFA) 108 (90 Base) MCG/ACT inhaler Inhale 1-2 puffs into the lungs every 6 (six) hours as needed for wheezing or shortness of breath. 10/04/15   Fisher, Roselyn BeringSusan W, PA-C  brompheniramine-pseudoephedrine-DM 30-2-10 MG/5ML syrup Take 5 mLs by mouth 4 (four) times daily as needed. 07/21/16   Joni ReiningSmith, Ronald K, PA-C  Dulaglutide (TRULICITY) 0.75 MG/0.5ML SOPN Inject into the skin. 09/16/15   [provider]  fenofibrate (TRICOR) 145 MG tablet Take 1 tablet (145 mg total) by mouth daily. Will need fasting labs in july 09/26/16   Sherrie MustacheFisher, Roselyn BeringSusan W, PA-C  fluticasone [facility](FLONASE) 50 MCG/ACT nasal spray Place 1 spray into both nostrils 2 (two) times daily. 10/04/15   Fisher, Roselyn BeringSusan W, PA-C  levofloxacin (LEVAQUIN) 500 MG tablet  Take 1 tablet (500 mg total) by mouth daily. Patient not taking: Reported on 07/21/2016 10/28/15   Faythe GheeFisher, Susan W, PA-C  lisinopril (PRINIVIL,ZESTRIL) 10 MG tablet TAKE 1 TABLET BY MOUTH TWICE DAILY 04/28/16   Sherrie MustacheFisher, Roselyn BeringSusan W, PA-C  loratadine (CLARITIN) 10 MG tablet Take 1 tablet (10 mg total) by mouth daily. 07/21/15   Betancourt, Jarold Songina A, NP  metformin (FORTAMET) 1000 MG (OSM) 24 hr tablet Take 1 tablet (1,000 mg total) by mouth 2 (two) times daily with a meal. 08/27/15   Fisher, Roselyn BeringSusan W, PA-C  metFORMIN (GLUCOPHAGE-XR) 500 MG 24 hr tablet TAKE 2 TABLETS BY MOUTH TWICE A DAY 06/14/16   Sherrie MustacheFisher, Roselyn BeringSusan W, PA-C  ondansetron (ZOFRAN ODT) 4 MG disintegrating tablet Take 1 tablet (4 mg total) by mouth every 8 (eight) hours as needed for nausea or vomiting. Patient not taking: Reported on 07/21/2016 06/06/16   Rebecka ApleyWebster, Allison P, MD  oxyCODONE-acetaminophen (ROXICET) 5-325 MG tablet Take 1 tablet by mouth every 6 (six) hours as needed. Patient not taking: Reported on 07/21/2016 06/06/16   Rebecka ApleyWebster, Allison P, MD  ranitidine (ZANTAC) 150 MG tablet TAKE ONE TABLET TWICE A DAY 07/17/16   Sherrie MustacheFisher, Roselyn BeringSusan W, PA-C  sucralfate (CARAFATE) 1 g tablet Take 1 tablet (1 g total) by mouth 2 (two) times daily. 06/06/16   Rebecka ApleyWebster, Allison P, MD    Allergies Patient has no known allergies.  History reviewed. No pertinent family history.  Social History Social History  Substance Use Topics  . Smoking status:  Former Smoker  . Smokeless tobacco: Never Used  . Alcohol use 0.0 oz/week    Review of Systems Constitutional: No fever/chills Eyes: No visual changes. ENT: No sore throat. Cardiovascular: Positive for chest pain. Respiratory: Denies shortness of breath. Gastrointestinal: No abdominal pain.  No nausea, no vomiting.  No diarrhea.   Genitourinary: Negative for dysuria. Musculoskeletal: Negative for back pain. Skin: Negative for rash. Neurological: Negative for headaches, focal weakness or  numbness.  ____________________________________________   PHYSICAL EXAM:  VITAL SIGNS: ED Triage Vitals  Enc Vitals Group     BP 12/02/16 1806 (!) 154/104     Pulse Rate 12/02/16 1806 93     Resp 12/02/16 1806 18     Temp 12/02/16 1806 98.6 F (37 C)     Temp Source 12/02/16 1806 Oral     SpO2 12/02/16 1806 100 %     Weight 12/02/16 1803 245 lb (111.1 kg)     Height 12/02/16 1803 6' (1.829 m)     Head Circumference --      Peak Flow --      Pain Score 12/02/16 1803 10     Pain Loc --      Pain Edu? --      Excl. in GC? --      Constitutional: Alert and oriented. Well appearing and in no distress. Eyes: Conjunctivae are normal.  ENT   Head: Normocephalic and atraumatic.   Nose: No congestion/rhinnorhea.   Mouth/Throat: Mucous membranes are moist.   Neck: No stridor. Hematological/Lymphatic/Immunilogical: No cervical lymphadenopathy. Cardiovascular: Normal rate, regular rhythm.  No murmurs, rubs, or gallops.  Respiratory: Normal respiratory effort without tachypnea nor retractions. Breath sounds are clear and equal bilaterally. No wheezes/rales/rhonchi. Gastrointestinal: Soft and mildly tender in epigastric and RUQ Genitourinary: Deferred Musculoskeletal: Normal range of motion in all extremities. No lower extremity edema. Neurologic:  Normal speech and language. No gross focal neurologic deficits are appreciated.  Skin:  Skin is warm, dry and intact. No rash noted. Psychiatric: Mood and affect are normal. Speech and behavior are normal. Patient exhibits appropriate insight and judgment.  ____________________________________________    LABS (pertinent positives/negatives)  Labs Reviewed  BASIC METABOLIC PANEL - Abnormal; Notable for the following:       Result Value   Chloride 96 (*)    Glucose, Bld 263 (*)    Calcium 10.4 (*)    All other components within normal limits  CBC - Abnormal; Notable for the following:    WBC 17.8 (*)    All other  components within normal limits  LIPASE, BLOOD - Abnormal; Notable for the following:    Lipase 88 (*)    All other components within normal limits  HEPATIC FUNCTION PANEL - Abnormal; Notable for the following:    Bilirubin, Direct <0.1 (*)    All other components within normal limits  TROPONIN I     ____________________________________________   EKG I, Phineas Semen, attending physician, personally viewed and interpreted this EKG  EKG Time: 1800 Rate: 94 Rhythm: normal sinus rhythm Axis: normal Intervals: qtc 390 QRS: narrow ST changes: no st elevation Impression: normal ekg   ____________________________________________    RADIOLOGY  CXR IMPRESSION: No active cardiopulmonary disease.  RUQ US IMPRESSION:  1. Unremarkable gallbladder.  2. Fatty liver.    ____________________________________________   PROCEDURES  Procedures  ____________________________________________   INITIAL IMPRESSION / ASSESSMENT AND PLAN / ED COURSE  Pertinent labs & imaging results that were available during my care of the patient  were reviewed by me and considered in my medical decision making (see chart for details).  Patient presented to the emergency department today because of concerns for epigastric pain. Patient did have a mild elevation of the lipase. His possible this could explain the patient's symptoms. Will plan on discharging with medications and help for possible gastritis as well as information on diet for pancreatitis.  ____________________________________________   FINAL CLINICAL IMPRESSION(S) / ED DIAGNOSES  Final diagnoses:  Epigastric abdominal pain     Note: This dictation was prepared with Dragon dictation. Any transcriptional errors that result from this process are unintentional     Phineas Semen, MD 12/02/16 2354

## 2016-12-02 NOTE — ED Notes (Signed)
Patient transported to Ultrasound 

## 2016-12-06 ENCOUNTER — Other Ambulatory Visit: Payer: Self-pay

## 2016-12-06 VITALS — BP 150/85 | HR 92 | Temp 98.5°F | Resp 16 | Wt 235.0 lb

## 2016-12-06 DIAGNOSIS — K859 Acute pancreatitis without necrosis or infection, unspecified: Secondary | ICD-10-CM

## 2016-12-06 DIAGNOSIS — W57XXXA Bitten or stung by nonvenomous insect and other nonvenomous arthropods, initial encounter: Secondary | ICD-10-CM

## 2016-12-06 MED ORDER — METFORMIN HCL ER (OSM) 1000 MG PO TB24: ORAL_TABLET | ORAL | 3 refills | Status: DC

## 2016-12-06 MED ORDER — FENOFIBRATE 145 MG PO TABS: 145.0000 mg | ORAL_TABLET | Freq: Every day | ORAL | 6 refills | Status: DC

## 2016-12-06 NOTE — Progress Notes (Signed)
S:  Pt c/o vomiting and diarrhea, sx for several days, no fever/chills, abd pain in ruq and both flanks, some cramping with diarrhea; denies cp/sob, denies camping, bad food, recent antibiotics, or exposure to bad water, has had unintentional weight loss over last few months, thinks he lost more since being sick, is concerned because this is the 4th episode of pancreatitis, his grandfather had the same sx with diabetes like he does and pancreatitis not related to etoh use, eventually they diagnosed him with pancreatic cancer; was seen in the ER and told to f/u with pcp then GI, also needs refill on metformin, states he takes 2 1000mg  pills bid; refill on tricor also, states they took himoff of trulicity while he was in the hospital Remainder ros neg,   O:  Vitals wnl, nad, ENT wnl, neck supple no lymph, lungs c t a, cv rrr, abd soft tender in ruq and epigastric area,  bs normal all 4 quads, neuro intact  A:  Viral gastroenteritis, pancreatitis, diabetes, hypertriglyceridemia, htn, (metabolic syndrome)  P:  Reassurance, fluids, brat diet, immodium ad for diarrhea if needed, return if not better in 3 days, return earlier if worsening. Labs drawn today, refer pt to GI due to pancreatic hx and fam hx of pancreatic CA; f/u with endocrinology

## 2016-12-10 LAB — CMP12+LP+TP+TSH+6AC+CBC/D/PLT
ALT: 28 IU/L (ref 0–44)
AST: 26 IU/L (ref 0–40)
Albumin/Globulin Ratio: 1.6 (ref 1.2–2.2)
Albumin: 4.7 g/dL (ref 3.5–5.5)
Alkaline Phosphatase: 69 IU/L (ref 39–117)
BASOS: 1 %
BILIRUBIN TOTAL: 0.5 mg/dL (ref 0.0–1.2)
BUN / CREAT RATIO: 17 (ref 9–20)
BUN: 14 mg/dL (ref 6–20)
Basophils Absolute: 0 10*3/uL (ref 0.0–0.2)
CALCIUM: 10.3 mg/dL — AB (ref 8.7–10.2)
CREATININE: 0.81 mg/dL (ref 0.76–1.27)
Chloride: 97 mmol/L (ref 96–106)
Chol/HDL Ratio: 6.4 ratio — ABNORMAL HIGH (ref 0.0–5.0)
Cholesterol, Total: 199 mg/dL (ref 100–199)
EOS (ABSOLUTE): 0.3 10*3/uL (ref 0.0–0.4)
EOS: 4 %
ESTIMATED CHD RISK: 1.4 times avg. — AB (ref 0.0–1.0)
Free Thyroxine Index: 2.4 (ref 1.2–4.9)
GFR calc Af Amer: 135 mL/min/{1.73_m2} (ref >59)
GFR, EST NON AFRICAN AMERICAN: 117 mL/min/{1.73_m2} (ref >59)
GGT: 77 IU/L — AB (ref 0–65)
GLUCOSE: 270 mg/dL — AB (ref 65–99)
Globulin, Total: 2.9 g/dL (ref 1.5–4.5)
HDL: 31 mg/dL — ABNORMAL LOW (ref >39)
HEMATOCRIT: 44.4 % (ref 37.5–51.0)
HEMOGLOBIN: 13.9 g/dL (ref 13.0–17.7)
IRON: 51 ug/dL (ref 38–169)
Immature Grans (Abs): 0 10*3/uL (ref 0.0–0.1)
Immature Granulocytes: 0 %
LDH: 135 IU/L (ref 121–224)
LYMPHS ABS: 2.2 10*3/uL (ref 0.7–3.1)
Lymphs: 28 %
MCH: 26.6 pg (ref 26.6–33.0)
MCHC: 31.3 g/dL — ABNORMAL LOW (ref 31.5–35.7)
MCV: 85 fL (ref 79–97)
Monocytes Absolute: 0.5 10*3/uL (ref 0.1–0.9)
Monocytes: 6 %
NEUTROS ABS: 5 10*3/uL (ref 1.4–7.0)
Neutrophils: 61 %
PLATELETS: 382 10*3/uL — AB (ref 150–379)
Phosphorus: 3.8 mg/dL (ref 2.5–4.5)
Potassium: 4.2 mmol/L (ref 3.5–5.2)
RBC: 5.22 x10E6/uL (ref 4.14–5.80)
RDW: 13.8 % (ref 12.3–15.4)
SODIUM: 139 mmol/L (ref 134–144)
T3 UPTAKE RATIO: 32 % (ref 24–39)
T4, Total: 7.5 ug/dL (ref 4.5–12.0)
TOTAL PROTEIN: 7.6 g/dL (ref 6.0–8.5)
TSH: 2.2 u[IU]/mL (ref 0.450–4.500)
Triglycerides: 539 mg/dL — ABNORMAL HIGH (ref 0–149)
URIC ACID: 4.9 mg/dL (ref 3.7–8.6)
WBC: 8.1 10*3/uL (ref 3.4–10.8)

## 2016-12-10 LAB — LIPASE: LIPASE: 25 U/L (ref 13–78)

## 2016-12-10 LAB — ROCKY MTN SPOTTED FVR AB, IGM-BLOOD: RMSF IgM: 0.3 index (ref 0.00–0.89)

## 2016-12-10 LAB — ROCKY MTN SPOTTED FVR AB, IGG-BLOOD: RMSF IgG: NEGATIVE

## 2016-12-10 LAB — HGB A1C W/O EAG: HEMOGLOBIN A1C: 9.4 % — AB (ref 4.8–5.6)

## 2016-12-10 LAB — B. BURGDORFI ANTIBODIES: Lyme IgG/IgM Ab: <0.91 {ISR} (ref 0.00–0.90)

## 2016-12-11 NOTE — Progress Notes (Signed)
Patient has been referred to see Dr. Tobi BastosAnna at [facility] on 12/12/2016 @ 1:15pm.

## 2016-12-12 ENCOUNTER — Other Ambulatory Visit: Payer: Self-pay | Admitting: Physician Assistant

## 2016-12-12 ENCOUNTER — Ambulatory Visit (INDEPENDENT_AMBULATORY_CARE_PROVIDER_SITE_OTHER): Payer: Managed Care, Other (non HMO) | Admitting: Gastroenterology

## 2016-12-12 ENCOUNTER — Encounter: Payer: Self-pay | Admitting: Gastroenterology

## 2016-12-12 ENCOUNTER — Ambulatory Visit: Payer: Self-pay | Admitting: Gastroenterology

## 2016-12-12 VITALS — BP 138/84 | HR 84 | Temp 98.4°F | Ht 71.0 in | Wt 238.6 lb

## 2016-12-12 DIAGNOSIS — R079 Chest pain, unspecified: Secondary | ICD-10-CM | POA: Diagnosis not present

## 2016-12-12 DIAGNOSIS — K219 Gastro-esophageal reflux disease without esophagitis: Secondary | ICD-10-CM

## 2016-12-12 DIAGNOSIS — K861 Other chronic pancreatitis: Secondary | ICD-10-CM

## 2016-12-12 DIAGNOSIS — Z299 Encounter for prophylactic measures, unspecified: Secondary | ICD-10-CM

## 2016-12-12 DIAGNOSIS — K859 Acute pancreatitis without necrosis or infection, unspecified: Secondary | ICD-10-CM

## 2016-12-12 NOTE — Progress Notes (Signed)
Wyline Mood MD, MRCP(U.K) [address]  [address]  [place], [place] [address]  Main: [phone]  Fax: [phone]   Gastroenterology Consultation  Referring Provider:     Faythe Ghee, PA-C Primary Care Physician:  Faythe Ghee, PA-C Primary Gastroenterologist:  Dr. Wyline Mood  Reason for Consultation:     Pancreatitis         HPI:   Dale Mendoza is a 36 y.o. y/o male referred for consultation & management  by Dr. Sherrie Mustache, Roselyn Bering, PA-C.   He  was initially seen at the ER on 06/05/16 with abdominal pain , lipase 62 , elevated WCC of 14.5 , proteinuria , glycosuria, Given a GI cocktail and discharged. Presented back to the ER on 12/02/16 with chest pain , nausea , vomiting , lipase 88 and discharged. Was commenced on pepcid and carate . He says some years back he has had a cardiac stress test .   Prior imaging 08/2014: mild uncomplicated pancreatitis. Hepatic steatosis . At that time his T bilirubin was 1.3  &/14!8- RUQ USG- No gall stones. He also recalls another episode of pancreatitis 1 year back. Recalls a prior hospitalization of r a week , started on a fenofibrate . Was told his triglycerides were the cause of his pancreatitis. TGL in 11/2011 was 1901.   Over the years has had 4 episodes of pancreatitis. No family history of pancreatitis, grand father had pancreatic cancer in his 90's who a smoker. His father also had some form of cancer .   No issues presently . Does have heartburn , used to be on Omeprzole, was changed to ranitidine which controls his symptoms. He has lost 10 lbs - unintentionally.   He has a bowel movement daily , 3-4 times a day , soft , not watery , foul smelling for a few years . Denies any recent antibiotic use.  Multiple family members have had their gall bladder taken out. When he does have a fatty meal he thinks he does get abdominal pain.   Came in to discuss further     CMP Latest Ref Rng & Units 12/02/2016 11/29/2016 06/05/2016  Glucose 65 -  99 mg/dL 696(E) 952(W) 413(K)  BUN 6 - 20 mg/dL 13 14 15   Creatinine 0.61 - 1.24 mg/dL 4.40 1.02 7.25  Sodium 135 - 145 mmol/L 135 139 137  Potassium 3.5 - 5.1 mmol/L 4.4 4.2 4.1  Chloride 101 - 111 mmol/L 96(L) 97 99(L)  CO2 22 - 32 mmol/L 24 - 27  Calcium 8.9 - 10.3 mg/dL 10.4(H) 10.3(H) 10.1  Total Protein 6.5 - 8.1 g/dL 8.1 7.6 7.7  Total Bilirubin 0.3 - 1.2 mg/dL 1.1 0.5 0.9  Alkaline Phos 38 - 126 U/L 79 69 55  AST 15 - 41 U/L 39 26 41  ALT 17 - 63 U/L 41 28 44     Past Medical History:  Diagnosis Date  . Diabetes mellitus without complication (HCC)   . Hyperlipidemia   . Hypertension   . Pancreatitis     Past Surgical History:  Procedure Laterality Date  . HERNIA REPAIR    . TONSILLECTOMY      Prior to Admission medications   Medication Sig Start Date End Date Taking? Authorizing Provider  albuterol (PROVENTIL HFA;VENTOLIN HFA) 108 (90 Base) MCG/ACT inhaler Inhale 1-2 puffs into the lungs every 6 (six) hours as needed for wheezing or shortness of breath. 10/04/15  Yes Faythe Ghee, PA-C  famotidine (PEPCID) 40  MG tablet Take 1 tablet (40 mg total) by mouth every evening. 12/02/16 12/02/17 Yes Phineas Semen, MD  fenofibrate (TRICOR) 145 MG tablet Take 1 tablet (145 mg total) by mouth daily. Will need fasting labs in july 12/06/16  Yes Fisher, Roselyn Bering, PA-C  fluticasone [facility]) 50 MCG/ACT nasal spray Place 1 spray into both nostrils 2 (two) times daily. 10/04/15  Yes Fisher, Roselyn Bering, PA-C  lisinopril (PRINIVIL,ZESTRIL) 10 MG tablet TAKE 1 TABLET BY MOUTH TWICE DAILY 04/28/16  Yes Fisher, Roselyn Bering, PA-C  loratadine (CLARITIN) 10 MG tablet Take 1 tablet (10 mg total) by mouth daily. 07/21/15  Yes Betancourt, Jarold Song, NP  metformin (FORTAMET) 1000 MG (OSM) 24 hr tablet Take 2 pills bid 12/06/16  Yes Fisher, Roselyn Bering, PA-C  ranitidine (ZANTAC) 150 MG tablet TAKE ONE TABLET TWICE A DAY 07/17/16  Yes Fisher, Roselyn Bering, PA-C  sucralfate (CARAFATE) 1 g tablet Take 1 tablet (1 g total)  by mouth 4 (four) times daily. 12/02/16  Yes Phineas Semen, MD  metFORMIN (GLUCOPHAGE-XR) 500 MG 24 hr tablet  12/06/16   [provider]    Family History  Problem Relation Age of Onset  . Colon cancer Father   . Pancreatic cancer Paternal Grandfather      Social History  Substance Use Topics  . Smoking status: Former Games developer  . Smokeless tobacco: Never Used  . Alcohol use 0.0 oz/week    Allergies as of 12/12/2016  . (No Known Allergies)    Review of Systems:    All systems reviewed and negative except where noted in HPI.   Physical Exam:  BP 138/84   Pulse 84   Temp 98.4 F (36.9 C) (Oral)   Ht 5\' 11"  (1.803 m)   Wt 238 lb 9.6 oz (108.2 kg)   BMI 33.28 kg/m  No LMP for male patient. Psych:  Alert and cooperative. Normal mood and affect. General:   Alert,  Well-developed, well-nourished, pleasant and cooperative in NAD Head:  Normocephalic and atraumatic. Eyes:  Sclera clear, no icterus.   Conjunctiva pink. Ears:  Normal auditory acuity. Nose:  No deformity, discharge, or lesions. Mouth:  No deformity or lesions,oropharynx pink & moist. Neck:  Supple; no masses or thyromegaly. Lungs:  Respirations even and unlabored.  Clear throughout to auscultation.   No wheezes, crackles, or rhonchi. No acute distress. Heart:  Regular rate and rhythm; no murmurs, clicks, rubs, or gallops. Abdomen:  Normal bowel sounds.  No bruits.  Soft, non-tender and non-distended without masses, hepatosplenomegaly or hernias noted.  No guarding or rebound tenderness.    Msk:  Symmetrical without gross deformities. Good, equal movement & strength bilaterally. Pulses:  Normal pulses noted. Extremities:  No clubbing or edema.  No cyanosis. Neurologic:  Alert and oriented x3;  grossly normal neurologically. Skin:  Intact without significant lesions or rashes. No jaundice. Lymph Nodes:  No significant cervical adenopathy. Psych:  Alert and cooperative. Normal mood and affect.  Imaging  Studies: Dg Chest 2 View  Result Date: 12/02/2016 CLINICAL DATA:  Chest pain EXAM: CHEST  2 VIEW COMPARISON:  06/05/2016 chest radiograph. FINDINGS: Stable cardiomediastinal silhouette with normal heart size. No pneumothorax. No pleural effusion. Lungs appear clear, with no acute consolidative airspace disease and no pulmonary edema. IMPRESSION: No active cardiopulmonary disease. Electronically Signed   By: Delbert Phenix M.D.   On: 12/02/2016 18:52   US Abdomen Limited Ruq  Result Date: 12/02/2016 CLINICAL DATA:  36 year old male with epigastric pain, and nausea. Elevated  lipase. EXAM: ULTRASOUND ABDOMEN LIMITED RIGHT UPPER QUADRANT COMPARISON:  Abdominal CT dated 09/05/2014 FINDINGS: Gallbladder: No gallstones or wall thickening visualized. No sonographic Murphy sign noted by sonographer. Common bile duct: Diameter: 6 mm Liver: There is diffuse increased liver echogenicity most consistent with fatty infiltration. Superimposed inflammation or fibrosis is not excluded. The main portal vein is patent with hepatopetal flow. IMPRESSION: 1. Unremarkable gallbladder. 2. Fatty liver. Electronically Signed   By: Elgie CollardArash  Radparvar M.D.   On: 12/02/2016 23:39    Assessment and Plan:   Dale Mendoza is a 36 y.o. y/o male has been referred for evaluation of recurrent pancreatitis. May be related to high triglycerides from diabetes, obesity. He has a strong family history of gall stones but none on his imaging so far , some abnormality in hi sLFT's a few years back which may indicate the same. Never been evaluated for structural issues of his pancreas.   Plan   1. Refer to a cardiologist for cardiac evaluation - he has risk factors and recently came into the Er with chest pain, he is known to suffer from very high triglyceride levels .  2. Refer to [facility] to evaluate if he would benefit from a cholecystectomy- He did have mildly elevated transaminases and T bilirubin during prior presentations of pancreatitis and he  may be passing small stones but he has none on imaging so far. Strong family history of gall stones 3. MRCP/MRI of pancreas to evaluate for pancreatic divisum as well as rule out pancreatic tumors 4. IGG 4 levels,TGL 5. Trial of CREON for 2 weeks to treat any pancreatic exocrine insufficiency as he has symptoms suggetsive of the same.  6. Counseled on life style changes, doing well on Zantac BID Advised on the use of a wedge pillow at night , avoid meals for 2 hours prior to bed time. Weight loss  Follow up in 6-8 weeks.   Dr Wyline MoodKiran Tikia Skilton MD,MRCP(U.K)

## 2016-12-12 NOTE — Progress Notes (Signed)
Patient came in to have blood drawn for testing per Dr. Wyline MoodKiran Anna at [facility].

## 2016-12-13 ENCOUNTER — Telehealth: Payer: Self-pay | Admitting: Surgery

## 2016-12-13 ENCOUNTER — Telehealth: Payer: Self-pay

## 2016-12-13 ENCOUNTER — Other Ambulatory Visit: Payer: Self-pay

## 2016-12-13 DIAGNOSIS — K859 Acute pancreatitis without necrosis or infection, unspecified: Secondary | ICD-10-CM

## 2016-12-13 DIAGNOSIS — R740 Nonspecific elevation of levels of transaminase and lactic acid dehydrogenase [LDH]: Secondary | ICD-10-CM

## 2016-12-13 DIAGNOSIS — R079 Chest pain, unspecified: Secondary | ICD-10-CM

## 2016-12-13 DIAGNOSIS — R7401 Elevation of levels of liver transaminase levels: Secondary | ICD-10-CM

## 2016-12-13 DIAGNOSIS — R17 Unspecified jaundice: Secondary | ICD-10-CM

## 2016-12-13 DIAGNOSIS — Z8379 Family history of other diseases of the digestive system: Secondary | ICD-10-CM

## 2016-12-13 LAB — SPECIMEN STATUS

## 2016-12-13 NOTE — Telephone Encounter (Signed)
I have called patient to make an appointment per referral. No answer. I have left a message on voicemail.   Reason for referral--  Evaluate if patient would benefit from a cholecystectomy. Mildly elevated transaminases and T-bilirubin during prior presentations of pancreatitis. May be passing small stone bun none seen on imaging. Strong family Hx of gall stones.     Referred by Dr Anna--GI.   Patient can be scheduled with any Surgeon.

## 2016-12-13 NOTE — Telephone Encounter (Signed)
Advised patient of critical triglyceride results.   Patient states he is taking Tricor medication at night daily.   Sent message to PCP concerning triglyceride management. Waiting for response.  Dr. Tobi BastosAnna has been advised and requests clarification on who will manage triglycerides, PCP or Cardiology.

## 2016-12-13 NOTE — Telephone Encounter (Signed)
Advised patient of MRCP on Friday, 7/27 @ [facility] @ 1115am. NPO 4 hours prior.

## 2016-12-14 ENCOUNTER — Other Ambulatory Visit: Payer: Self-pay | Admitting: Gastroenterology

## 2016-12-14 DIAGNOSIS — K859 Acute pancreatitis without necrosis or infection, unspecified: Secondary | ICD-10-CM

## 2016-12-14 LAB — TRIGLYCERIDES: TRIGLYCERIDES: 1282 mg/dL — AB (ref 0–149)

## 2016-12-14 LAB — SPECIMEN STATUS REPORT

## 2016-12-14 LAB — IGG: IGG (IMMUNOGLOBIN G), SERUM: 690 mg/dL — AB (ref 700–1600)

## 2016-12-15 ENCOUNTER — Ambulatory Visit: Payer: Managed Care, Other (non HMO)

## 2016-12-15 ENCOUNTER — Ambulatory Visit
Admission: RE | Admit: 2016-12-15 | Discharge: 2016-12-15 | Disposition: A | Discharge status: Home / Self Care | Payer: Managed Care, Other (non HMO) | Source: Ambulatory Visit | Attending: Gastroenterology | Admitting: Gastroenterology

## 2016-12-15 DIAGNOSIS — N281 Cyst of kidney, acquired: Secondary | ICD-10-CM | POA: Insufficient documentation

## 2016-12-15 DIAGNOSIS — K760 Fatty (change of) liver, not elsewhere classified: Secondary | ICD-10-CM

## 2016-12-15 DIAGNOSIS — K861 Other chronic pancreatitis: Secondary | ICD-10-CM

## 2016-12-15 DIAGNOSIS — K859 Acute pancreatitis without necrosis or infection, unspecified: Secondary | ICD-10-CM

## 2016-12-15 MED ORDER — GADOBENATE DIMEGLUMINE 529 MG/ML IV SOLN
20.0000 mL | Freq: Once | INTRAVENOUS | Status: AC | PRN
  Administered 2016-12-15: 20 mL via INTRAVENOUS

## 2016-12-17 ENCOUNTER — Inpatient Hospital Stay
Admission: EM | Admit: 2016-12-17 | Discharge: 2016-12-18 | DRG: 440 | Disposition: A | Discharge status: Home / Self Care | Payer: Managed Care, Other (non HMO) | Attending: Specialist | Admitting: Specialist

## 2016-12-17 ENCOUNTER — Encounter: Payer: Self-pay | Admitting: Emergency Medicine

## 2016-12-17 DIAGNOSIS — Z7984 Long term (current) use of oral hypoglycemic drugs: Secondary | ICD-10-CM | POA: Diagnosis not present

## 2016-12-17 DIAGNOSIS — E785 Hyperlipidemia, unspecified: Secondary | ICD-10-CM | POA: Diagnosis present

## 2016-12-17 DIAGNOSIS — E781 Pure hyperglyceridemia: Secondary | ICD-10-CM | POA: Diagnosis present

## 2016-12-17 DIAGNOSIS — I10 Essential (primary) hypertension: Secondary | ICD-10-CM | POA: Diagnosis present

## 2016-12-17 DIAGNOSIS — K859 Acute pancreatitis without necrosis or infection, unspecified: Secondary | ICD-10-CM

## 2016-12-17 DIAGNOSIS — E1165 Type 2 diabetes mellitus with hyperglycemia: Secondary | ICD-10-CM | POA: Diagnosis present

## 2016-12-17 DIAGNOSIS — Z87891 Personal history of nicotine dependence: Secondary | ICD-10-CM | POA: Diagnosis not present

## 2016-12-17 DIAGNOSIS — Z800 Family history of malignant neoplasm of digestive organs: Secondary | ICD-10-CM

## 2016-12-17 DIAGNOSIS — IMO0002 Reserved for concepts with insufficient information to code with codable children: Secondary | ICD-10-CM | POA: Diagnosis present

## 2016-12-17 DIAGNOSIS — E119 Type 2 diabetes mellitus without complications: Secondary | ICD-10-CM

## 2016-12-17 LAB — COMPREHENSIVE METABOLIC PANEL
ALBUMIN: 4.6 g/dL (ref 3.5–5.0)
ALT: 35 U/L (ref 17–63)
ANION GAP: 14 (ref 5–15)
AST: 36 U/L (ref 15–41)
Alkaline Phosphatase: 58 U/L (ref 38–126)
BUN: 8 mg/dL (ref 6–20)
CALCIUM: 10.2 mg/dL (ref 8.9–10.3)
CHLORIDE: 97 mmol/L — AB (ref 101–111)
CO2: 24 mmol/L (ref 22–32)
CREATININE: 0.88 mg/dL (ref 0.61–1.24)
GFR calc Af Amer: >60 mL/min (ref >60)
GFR calc non Af Amer: >60 mL/min (ref >60)
GLUCOSE: 280 mg/dL — AB (ref 65–99)
POTASSIUM: 4.3 mmol/L (ref 3.5–5.1)
SODIUM: 135 mmol/L (ref 135–145)
TOTAL PROTEIN: 8.1 g/dL (ref 6.5–8.1)
Total Bilirubin: 1.1 mg/dL (ref 0.3–1.2)

## 2016-12-17 LAB — CBC
HCT: 42.3 % (ref 40.0–52.0)
HEMOGLOBIN: 14.6 g/dL (ref 13.0–18.0)
MCH: 28.1 pg (ref 26.0–34.0)
MCHC: 34.6 g/dL (ref 32.0–36.0)
MCV: 81.1 fL (ref 80.0–100.0)
PLATELETS: 424 10*3/uL (ref 150–440)
RBC: 5.22 MIL/uL (ref 4.40–5.90)
RDW: 13 % (ref 11.5–14.5)
WBC: 16.9 10*3/uL — ABNORMAL HIGH (ref 3.8–10.6)

## 2016-12-17 LAB — LIPASE, BLOOD: LIPASE: 535 U/L — AB (ref 11–51)

## 2016-12-17 LAB — TROPONIN I

## 2016-12-17 LAB — GLUCOSE, CAPILLARY
Glucose-Capillary: 233 mg/dL — ABNORMAL HIGH (ref 65–99)
Glucose-Capillary: 215 mg/dL — ABNORMAL HIGH (ref 65–99)
Glucose-Capillary: 181 mg/dL — ABNORMAL HIGH (ref 65–99)

## 2016-12-17 LAB — TSH: TSH: 1.14 u[IU]/mL (ref 0.350–4.500)

## 2016-12-17 MED ORDER — FLUTICASONE PROPIONATE 50 MCG/ACT NA SUSP
1.0000 | Freq: Two times a day (BID) | NASAL | Status: DC
  Filled 2016-12-17: qty 16

## 2016-12-17 MED ORDER — ONDANSETRON HCL 4 MG/2ML IJ SOLN
INTRAMUSCULAR | Status: AC
  Filled 2016-12-17: qty 2

## 2016-12-17 MED ORDER — MORPHINE SULFATE (PF) 2 MG/ML IV SOLN
2.0000 mg | INTRAVENOUS | Status: DC | PRN
  Administered 2016-12-17 (×2): 2 mg via INTRAVENOUS
  Administered 2016-12-17: 4 mg via INTRAVENOUS
  Administered 2016-12-17 – 2016-12-18 (×2): 2 mg via INTRAVENOUS
  Filled 2016-12-17 (×2): qty 1
  Filled 2016-12-17: qty 2
  Filled 2016-12-17 (×2): qty 1

## 2016-12-17 MED ORDER — GI COCKTAIL ~~LOC~~
30.0000 mL | Freq: Once | ORAL | Status: AC
  Administered 2016-12-17: 30 mL via ORAL
  Filled 2016-12-17: qty 30

## 2016-12-17 MED ORDER — ONDANSETRON HCL 4 MG/2ML IJ SOLN
4.0000 mg | Freq: Once | INTRAMUSCULAR | Status: AC
  Administered 2016-12-17: 4 mg via INTRAVENOUS
  Filled 2016-12-17: qty 2

## 2016-12-17 MED ORDER — SODIUM CHLORIDE 0.9 % IV BOLUS (SEPSIS)
1000.0000 mL | Freq: Once | INTRAVENOUS | Status: AC
  Administered 2016-12-17: 1000 mL via INTRAVENOUS

## 2016-12-17 MED ORDER — LORATADINE 10 MG PO TABS
10.0000 mg | ORAL_TABLET | Freq: Every day | ORAL | Status: DC
  Administered 2016-12-18: 10 mg via ORAL
  Filled 2016-12-17: qty 1

## 2016-12-17 MED ORDER — ENOXAPARIN SODIUM 40 MG/0.4ML ~~LOC~~ SOLN
40.0000 mg | SUBCUTANEOUS | Status: DC
  Filled 2016-12-17: qty 0.4

## 2016-12-17 MED ORDER — LISINOPRIL 10 MG PO TABS
10.0000 mg | ORAL_TABLET | Freq: Two times a day (BID) | ORAL | Status: DC
  Administered 2016-12-17 – 2016-12-18 (×3): 10 mg via ORAL
  Filled 2016-12-17 (×3): qty 1

## 2016-12-17 MED ORDER — ONDANSETRON HCL 4 MG/2ML IJ SOLN
4.0000 mg | Freq: Four times a day (QID) | INTRAMUSCULAR | Status: DC | PRN
  Administered 2016-12-17: 4 mg via INTRAVENOUS

## 2016-12-17 MED ORDER — INSULIN GLARGINE 100 UNIT/ML ~~LOC~~ SOLN
10.0000 [IU] | Freq: Every day | SUBCUTANEOUS | Status: DC
  Filled 2016-12-17: qty 0.1

## 2016-12-17 MED ORDER — DOCUSATE SODIUM 100 MG PO CAPS
100.0000 mg | ORAL_CAPSULE | Freq: Two times a day (BID) | ORAL | Status: DC
Start: 2016-12-17 — End: 2016-12-18
  Administered 2016-12-17 – 2016-12-18 (×2): 100 mg via ORAL
  Filled 2016-12-17 (×2): qty 1

## 2016-12-17 MED ORDER — FENTANYL CITRATE (PF) 100 MCG/2ML IJ SOLN
100.0000 ug | Freq: Once | INTRAMUSCULAR | Status: AC
  Administered 2016-12-17: 100 ug via INTRAVENOUS
  Filled 2016-12-17: qty 2

## 2016-12-17 MED ORDER — ONDANSETRON HCL 4 MG PO TABS: 4.0000 mg | ORAL_TABLET | Freq: Four times a day (QID) | ORAL | Status: DC | PRN

## 2016-12-17 MED ORDER — SODIUM CHLORIDE 0.9 % IV SOLN
INTRAVENOUS | Status: DC
  Administered 2016-12-17 – 2016-12-18 (×4): via INTRAVENOUS

## 2016-12-17 MED ORDER — FENOFIBRATE 160 MG PO TABS
160.0000 mg | ORAL_TABLET | Freq: Every day | ORAL | Status: DC
  Filled 2016-12-17 (×3): qty 1

## 2016-12-17 MED ORDER — FAMOTIDINE 20 MG PO TABS
20.0000 mg | ORAL_TABLET | Freq: Two times a day (BID) | ORAL | Status: DC
  Administered 2016-12-17 – 2016-12-18 (×3): 20 mg via ORAL
  Filled 2016-12-17 (×3): qty 1

## 2016-12-17 MED ORDER — MORPHINE SULFATE (PF) 4 MG/ML IV SOLN
INTRAVENOUS | Status: AC
  Filled 2016-12-17: qty 1

## 2016-12-17 MED ORDER — ACETAMINOPHEN 325 MG PO TABS: 650.0000 mg | ORAL_TABLET | Freq: Four times a day (QID) | ORAL | Status: DC | PRN

## 2016-12-17 MED ORDER — ACETAMINOPHEN 650 MG RE SUPP
650.0000 mg | Freq: Four times a day (QID) | RECTAL | Status: DC | PRN
Start: 2016-12-17 — End: 2016-12-18

## 2016-12-17 MED ORDER — ALBUTEROL SULFATE (2.5 MG/3ML) 0.083% IN NEBU: 2.5000 mg | INHALATION_SOLUTION | RESPIRATORY_TRACT | Status: DC | PRN

## 2016-12-17 MED ORDER — ONDANSETRON 4 MG PO TBDP
4.0000 mg | ORAL_TABLET | Freq: Once | ORAL | Status: AC | PRN
  Administered 2016-12-17: 4 mg via ORAL
  Filled 2016-12-17: qty 1

## 2016-12-17 MED ORDER — SUCRALFATE 1 G PO TABS
1.0000 g | ORAL_TABLET | Freq: Four times a day (QID) | ORAL | Status: DC
  Administered 2016-12-17 – 2016-12-18 (×4): 1 g via ORAL
  Filled 2016-12-17 (×4): qty 1

## 2016-12-17 MED ORDER — INSULIN ASPART 100 UNIT/ML ~~LOC~~ SOLN
0.0000 [IU] | Freq: Four times a day (QID) | SUBCUTANEOUS | Status: DC
  Administered 2016-12-17: 3 [IU] via SUBCUTANEOUS
  Administered 2016-12-17 – 2016-12-18 (×4): 2 [IU] via SUBCUTANEOUS
  Filled 2016-12-17 (×5): qty 1

## 2016-12-17 NOTE — H&P (Signed)
Dale Mendoza is an 36 y.o. male.   Chief Complaint: Abdominal pain HPI: The patient with past medical history of pancreatitis as well as diabetes and hyperlipidemia presents emergency department complaining of abdominal pain. The patient has had waxing and waning pain in his upper epigastrium radiating to his left flank for the last 2 weeks. He recently underwent MRCP that did not show mass or stenosis of the bile duct. Lipase was found to be greater than 500. The patient was placed on intravenous fluid and given analgesic medication without relief which prompted the emergency department staff to call the hospitalist service for admission.  Past Medical History:  Diagnosis Date  . Diabetes mellitus without complication ([facility])   . Hyperlipidemia   . Hypertension   . Pancreatitis     Past Surgical History:  Procedure Laterality Date  . HERNIA REPAIR    . TONSILLECTOMY      Family History  Problem Relation Age of Onset  . Colon cancer Father   . Pancreatic cancer Paternal Grandfather    Social History:  reports that he has quit smoking. He has never used smokeless tobacco. He reports that he drinks alcohol. He reports that he does not use drugs.  Allergies: No Known Allergies  Prior to Admission medications   Medication Sig Start Date End Date Taking? Authorizing Provider  albuterol (PROVENTIL HFA;VENTOLIN HFA) 108 (90 Base) MCG/ACT inhaler Inhale 1-2 puffs into the lungs every 6 (six) hours as needed for wheezing or shortness of breath. 10/04/15  Yes Fisher, Linden Dolin, PA-C  famotidine (PEPCID) 40 MG tablet Take 1 tablet (40 mg total) by mouth every evening. 12/02/16 12/02/17 Yes Nance Pear, MD  fenofibrate (TRICOR) 145 MG tablet Take 1 tablet (145 mg total) by mouth daily. Will need fasting labs in july 12/06/16  Yes Fisher, Linden Dolin, PA-C  fluticasone [facility]) 50 MCG/ACT nasal spray Place 1 spray into both nostrils 2 (two) times daily. 10/04/15  Yes Fisher, Linden Dolin, PA-C  lisinopril  (PRINIVIL,ZESTRIL) 10 MG tablet TAKE 1 TABLET BY MOUTH TWICE DAILY 04/28/16  Yes Fisher, Linden Dolin, PA-C  loratadine (CLARITIN) 10 MG tablet Take 1 tablet (10 mg total) by mouth daily. 07/21/15  Yes Betancourt, Aura Fey, NP  metformin (FORTAMET) 1000 MG (OSM) 24 hr tablet Take 2 pills bid Patient taking differently: Take 2,000 mg by mouth 2 (two) times daily with a meal.  12/06/16  Yes Fisher, Linden Dolin, PA-C  ranitidine (ZANTAC) 150 MG tablet TAKE ONE TABLET TWICE A DAY 07/17/16  Yes Fisher, Linden Dolin, PA-C  sucralfate (CARAFATE) 1 g tablet Take 1 tablet (1 g total) by mouth 4 (four) times daily. 12/02/16  Yes Nance Pear, MD     Results for orders placed or performed during the hospital encounter of 12/17/16 (from the past 48 hour(s))  Lipase, blood     Status: Abnormal   Collection Time: 12/17/16  3:25 AM  Result Value Ref Range   Lipase 535 (H) 11 - 51 U/L    Comment: CRITICAL VALUE NOTED. VALUE IS CONSISTENT WITH PREVIOUSLY REPORTED/CALLED VALUE.PMH  Comprehensive metabolic panel     Status: Abnormal   Collection Time: 12/17/16  3:25 AM  Result Value Ref Range   Sodium 135 135 - 145 mmol/L   Potassium 4.3 3.5 - 5.1 mmol/L   Chloride 97 (L) 101 - 111 mmol/L   CO2 24 22 - 32 mmol/L   Glucose, Bld 280 (H) 65 - 99 mg/dL   BUN 8 6 -  20 mg/dL   Creatinine, Ser 0.88 0.61 - 1.24 mg/dL   Calcium 10.2 8.9 - 10.3 mg/dL   Total Protein 8.1 6.5 - 8.1 g/dL   Albumin 4.6 3.5 - 5.0 g/dL   AST 36 15 - 41 U/L   ALT 35 17 - 63 U/L   Alkaline Phosphatase 58 38 - 126 U/L   Total Bilirubin 1.1 0.3 - 1.2 mg/dL   GFR calc non Af Amer >60 >60 mL/min   GFR calc Af Amer >60 >60 mL/min    Comment: (NOTE) The eGFR has been calculated using the CKD EPI equation. This calculation has not been validated in all clinical situations. eGFR's persistently <60 mL/min signify possible Chronic Kidney Disease.    Anion gap 14 5 - 15  CBC     Status: Abnormal   Collection Time: 12/17/16  3:25 AM  Result Value Ref Range    WBC 16.9 (H) 3.8 - 10.6 K/uL   RBC 5.22 4.40 - 5.90 MIL/uL   Hemoglobin 14.6 13.0 - 18.0 g/dL   HCT 42.3 40.0 - 52.0 %   MCV 81.1 80.0 - 100.0 fL   MCH 28.1 26.0 - 34.0 pg   MCHC 34.6 32.0 - 36.0 g/dL   RDW 13.0 11.5 - 14.5 %   Platelets 424 150 - 440 K/uL  Troponin I     Status: None   Collection Time: 12/17/16  3:25 AM  Result Value Ref Range   Troponin I <0.03 <0.03 ng/mL   Mr 3d Recon At Scanner  Result Date: 12/15/2016 CLINICAL DATA:  36 year old male with history of recurrent pancreatitis. EXAM: MRI ABDOMEN WITHOUT AND WITH CONTRAST (INCLUDING MRCP) TECHNIQUE: Multiplanar multisequence MR imaging of the abdomen was performed both before and after the administration of intravenous contrast. Heavily T2-weighted images of the biliary and pancreatic ducts were obtained, and three-dimensional MRCP images were rendered by post processing. CONTRAST:  90m MULTIHANCE GADOBENATE DIMEGLUMINE 529 MG/ML IV SOLN COMPARISON:  No prior abdominal MRI. CT the abdomen and pelvis 09/05/2014. FINDINGS: Lower chest: Unremarkable. Hepatobiliary: Diffuse loss of signal intensity throughout the hepatic parenchyma on out of phase dual echo images, indicative of severe hepatic steatosis. No cystic or solid hepatic lesions. No intra or extrahepatic biliary ductal dilatation noted on MRCP images. Common bile duct measures 3 mm in the porta hepatis. Gallbladder is normal in appearance. Pancreas: No pancreatic mass. No pancreatic ductal dilatation noted on MRCP images. No pancreatic or peripancreatic fluid or inflammatory changes. Spleen:  Unremarkable. Adrenals/Urinary Tract: 1.7 cm lesion in the upper pole of the left kidney demonstrates low T1 signal intensity, high T2 signal intensity and no enhancement, compatible with a simple cyst. Right kidney and bilateral adrenal glands are normal in appearance. No hydroureteronephrosis in the visualized portions of the abdomen. Stomach/Bowel: Visualized portions are  unremarkable. Vascular/Lymphatic: No aneurysm identified in the visualized abdominal vasculature. No lymphadenopathy noted in the visualized abdomen. Other: No significant volume of ascites noted in the visualized portions of the peritoneal cavity. Musculoskeletal: No aggressive appearing osseous lesions are noted in the visualized portions of the skeleton. IMPRESSION: 1. No acute abnormality of the pancreas. No pancreatic pseudocyst or other complications of prior episodes of pancreatitis noted. 2. No cholelithiasis or choledocholithiasis. No findings of biliary tract obstruction. 3. Severe hepatic steatosis. 4. Small simple cyst in the upper pole of the left kidney incidentally noted. Electronically Signed   By: DVinnie LangtonM.D.   On: 12/15/2016 14:22   Mr Abdomen Mrcp W Wo Contast  Result Date: 12/15/2016 CLINICAL DATA:  36 year old male with history of recurrent pancreatitis. EXAM: MRI ABDOMEN WITHOUT AND WITH CONTRAST (INCLUDING MRCP) TECHNIQUE: Multiplanar multisequence MR imaging of the abdomen was performed both before and after the administration of intravenous contrast. Heavily T2-weighted images of the biliary and pancreatic ducts were obtained, and three-dimensional MRCP images were rendered by post processing. CONTRAST:  55m MULTIHANCE GADOBENATE DIMEGLUMINE 529 MG/ML IV SOLN COMPARISON:  No prior abdominal MRI. CT the abdomen and pelvis 09/05/2014. FINDINGS: Lower chest: Unremarkable. Hepatobiliary: Diffuse loss of signal intensity throughout the hepatic parenchyma on out of phase dual echo images, indicative of severe hepatic steatosis. No cystic or solid hepatic lesions. No intra or extrahepatic biliary ductal dilatation noted on MRCP images. Common bile duct measures 3 mm in the porta hepatis. Gallbladder is normal in appearance. Pancreas: No pancreatic mass. No pancreatic ductal dilatation noted on MRCP images. No pancreatic or peripancreatic fluid or inflammatory changes. Spleen:   Unremarkable. Adrenals/Urinary Tract: 1.7 cm lesion in the upper pole of the left kidney demonstrates low T1 signal intensity, high T2 signal intensity and no enhancement, compatible with a simple cyst. Right kidney and bilateral adrenal glands are normal in appearance. No hydroureteronephrosis in the visualized portions of the abdomen. Stomach/Bowel: Visualized portions are unremarkable. Vascular/Lymphatic: No aneurysm identified in the visualized abdominal vasculature. No lymphadenopathy noted in the visualized abdomen. Other: No significant volume of ascites noted in the visualized portions of the peritoneal cavity. Musculoskeletal: No aggressive appearing osseous lesions are noted in the visualized portions of the skeleton. IMPRESSION: 1. No acute abnormality of the pancreas. No pancreatic pseudocyst or other complications of prior episodes of pancreatitis noted. 2. No cholelithiasis or choledocholithiasis. No findings of biliary tract obstruction. 3. Severe hepatic steatosis. 4. Small simple cyst in the upper pole of the left kidney incidentally noted. Electronically Signed   By: DVinnie LangtonM.D.   On: 12/15/2016 14:22    Review of Systems  Constitutional: Negative for chills and fever.  HENT: Negative for sore throat and tinnitus.   Eyes: Negative for blurred vision and redness.  Respiratory: Negative for cough and shortness of breath.   Cardiovascular: Negative for chest pain, palpitations, orthopnea and PND.  Gastrointestinal: Positive for abdominal pain, nausea and vomiting. Negative for diarrhea.  Genitourinary: Negative for dysuria, frequency and urgency.  Musculoskeletal: Negative for joint pain and myalgias.  Skin: Negative for rash.       No lesions  Neurological: Negative for speech change, focal weakness and weakness.  Endo/Heme/Allergies: Does not bruise/bleed easily.       No temperature intolerance  Psychiatric/Behavioral: Negative for depression and suicidal ideas.     Blood pressure (!) 168/95, pulse 99, resp. rate (!) 22, height 5' 11"  (1.803 m), weight 108 kg (238 lb), SpO2 95 %. Physical Exam  Constitutional: He is oriented to person, place, and time. He appears well-developed and well-nourished. No distress.  HENT:  Head: Normocephalic and atraumatic.  Mouth/Throat: Oropharynx is clear and moist.  Eyes: Pupils are equal, round, and reactive to light. Conjunctivae and EOM are normal. No scleral icterus.  Neck: Normal range of motion. Neck supple. No JVD present. No tracheal deviation present. No thyromegaly present.  Cardiovascular: Normal rate, regular rhythm and normal heart sounds.  Exam reveals no gallop and no friction rub.   No murmur heard. Respiratory: Effort normal and breath sounds normal. No respiratory distress.  GI: Soft. Bowel sounds are normal. He exhibits no distension and no mass. There is tenderness. There  is no rebound and no guarding.  Genitourinary:  Genitourinary Comments: Deferred  Musculoskeletal: Normal range of motion. He exhibits no edema.  Lymphadenopathy:    He has no cervical adenopathy.  Neurological: He is alert and oriented to person, place, and time. No cranial nerve deficit.  Skin: Skin is warm and dry. No rash noted. No erythema.  Psychiatric: He has a normal mood and affect. His behavior is normal. Judgment and thought content normal.     Assessment/Plan This is a 36 year old male admitted for pancreatitis. 1. Pancreatitis: Recurrent; likely secondary to hypertriglyceridemia. Aggressive IV hydration and pain management 2. Hyperlipidemia: Recheck triglycerides.Continue fibrate and encourage fat-free diet. 3. Diabetes mellitus type 2: Discontinue metformin. Start basal insulin as well as sliding scale while hospitalized 4. Essential hypertension: Uncontrolled partly due to pain. Continue lisinopril 5. DVT prophylaxis: Lovenox 6. GI prophylaxis: Famotidine The patient is a full code. Time spent on admission  orders and patient care approximately 45 minutes  Harrie Foreman, MD 12/17/2016, 7:26 AM

## 2016-12-17 NOTE — ED Provider Notes (Signed)
[facility] Emergency Department Provider Note  ____________________________________________   First MD Initiated Contact with Patient 12/17/16 [phone]     (approximate)  I have reviewed the triage vital signs and the nursing notes.   HISTORY  Chief Complaint Abdominal Pain    HPI Dale Mendoza is a 36 y.o. male who self presents the emergency Department with roughly 24 hours of severe mid abdominal pain radiating to his back associated with nausea and vomiting. He last ate solid food over 24 hours ago because every time he has tried to drink liquids for the past 24 hours he has vomited. He has had 3 episodes of pancreatitis this year and several episodes in the past. He is currently followed by Dr. Hiram GashAnnaof gastroenterology for recurrent pancreatitis of unclear etiology. 2 days ago he had an MRI of his pancreas which was normal. As not drunk alcohol in several months. He has no history of gallstones. He has a family history of pancreatic cancer.   Past Medical History:  Diagnosis Date  . Diabetes mellitus without complication (HCC)   . Hyperlipidemia   . Hypertension   . Pancreatitis     Patient Active Problem List   Diagnosis Date Noted  . Pancreatitis 12/17/2016    Past Surgical History:  Procedure Laterality Date  . HERNIA REPAIR    . TONSILLECTOMY      Prior to Admission medications   Medication Sig Start Date End Date Taking? Authorizing Provider  albuterol (PROVENTIL HFA;VENTOLIN HFA) 108 (90 Base) MCG/ACT inhaler Inhale 1-2 puffs into the lungs every 6 (six) hours as needed for wheezing or shortness of breath. 10/04/15  Yes Fisher, Roselyn BeringSusan W, PA-C  famotidine (PEPCID) 40 MG tablet Take 1 tablet (40 mg total) by mouth every evening. 12/02/16 12/02/17 Yes Phineas SemenGoodman, Graydon, MD  fenofibrate (TRICOR) 145 MG tablet Take 1 tablet (145 mg total) by mouth daily. Will need fasting labs in july 12/06/16  Yes Fisher, Roselyn BeringSusan W, PA-C  fluticasone [facility](FLONASE) 50  MCG/ACT nasal spray Place 1 spray into both nostrils 2 (two) times daily. 10/04/15  Yes Fisher, Roselyn BeringSusan W, PA-C  lisinopril (PRINIVIL,ZESTRIL) 10 MG tablet TAKE 1 TABLET BY MOUTH TWICE DAILY 04/28/16  Yes Fisher, Roselyn BeringSusan W, PA-C  loratadine (CLARITIN) 10 MG tablet Take 1 tablet (10 mg total) by mouth daily. 07/21/15  Yes Betancourt, Jarold Songina A, NP  metformin (FORTAMET) 1000 MG (OSM) 24 hr tablet Take 2 pills bid Patient taking differently: Take 2,000 mg by mouth 2 (two) times daily with a meal.  12/06/16  Yes Fisher, Roselyn BeringSusan W, PA-C  ranitidine (ZANTAC) 150 MG tablet TAKE ONE TABLET TWICE A DAY 07/17/16  Yes Fisher, Roselyn BeringSusan W, PA-C  sucralfate (CARAFATE) 1 g tablet Take 1 tablet (1 g total) by mouth 4 (four) times daily. 12/02/16  Yes Phineas SemenGoodman, Graydon, MD    Allergies Patient has no known allergies.  Family History  Problem Relation Age of Onset  . Colon cancer Father   . Pancreatic cancer Paternal Grandfather     Social History Social History  Substance Use Topics  . Smoking status: Former Games developermoker  . Smokeless tobacco: Never Used  . Alcohol use 0.0 oz/week    Review of Systems Constitutional: No fever/chills Eyes: No visual changes. ENT: No sore throat. Cardiovascular: Denies chest pain. Respiratory: Denies shortness of breath. Gastrointestinal: Positive abdominal pain.  Positive nausea, positive vomiting.  No diarrhea.  No constipation. Genitourinary: Negative for dysuria. Musculoskeletal: Negative for back pain. Skin: Negative for rash. Neurological:  Negative for headaches, focal weakness or numbness.   ____________________________________________   PHYSICAL EXAM:  VITAL SIGNS: ED Triage Vitals  Enc Vitals Group     BP 12/17/16 0248 (!) 153/90     Pulse Rate 12/17/16 0248 91     Resp 12/17/16 0248 20     Temp --      Temp Source 12/17/16 0248 Oral     SpO2 12/17/16 0248 100 %     Weight 12/17/16 0250 238 lb (108 kg)     Height 12/17/16 0250 5\' 11"  (1.803 m)     Head  Circumference --      Peak Flow --      Pain Score 12/17/16 0247 10     Pain Loc --      Pain Edu? --      Excl. in GC? --     Constitutional: Alert and oriented 4 appears extremely uncomfortable grimacing holding his abdomen Eyes: PERRL EOMI. Head: Atraumatic. Nose: No congestion/rhinnorhea. Mouth/Throat: No trismus Neck: No stridor.   Cardiovascular: Normal rate, regular rhythm. Grossly normal heart sounds.  Good peripheral circulation. Respiratory: Normal respiratory effort.  No retractions. Lungs CTAB and moving good air Gastrointestinal: No peritonitis but diffuse tenderness with no focality no rebound or guarding Musculoskeletal: No lower extremity edema   Neurologic:  Normal speech and language. No gross focal neurologic deficits are appreciated. Skin:  Skin is warm, dry and intact. No rash noted. Psychiatric: Mood and affect are normal. Speech and behavior are normal.    ____________________________________________   DIFFERENTIAL includes but not limited to  Pancreatitis, hypertriglyceridemia, gallstones, alcohol abuse ____________________________________________   LABS (all labs ordered are listed, but only abnormal results are displayed)  Labs Reviewed  LIPASE, BLOOD - Abnormal; Notable for the following:       Result Value   Lipase 535 (*)    All other components within normal limits  COMPREHENSIVE METABOLIC PANEL - Abnormal; Notable for the following:    Chloride 97 (*)    Glucose, Bld 280 (*)    All other components within normal limits  CBC - Abnormal; Notable for the following:    WBC 16.9 (*)    All other components within normal limits  TROPONIN I  HIV ANTIBODY (ROUTINE TESTING)    Extremely elevated lipase consistent with pancreatitis elevated white count is nonspecific and likely represents  stress __________________________________________  EKG   ____________________________________________  RADIOLOGY   ____________________________________________   PROCEDURES  Procedure(s) performed: no  Procedures  Critical Care performed: no  Observation: no ____________________________________________   INITIAL IMPRESSION / ASSESSMENT AND PLAN / ED COURSE  Pertinent labs & imaging results that were available during my care of the patient were reviewed by me and considered in my medical decision making (see chart for details).  The patient is extremely uncomfortable with an elevated lipase nausea vomiting and inability to tolerate liquids orally. At this point he requires intravenous fluids, intravenous antiemetics and opioid pain medications. Because he cannot eat or drink to requires inpatient admission for bowel rest and further treatment.      ____________________________________________   FINAL CLINICAL IMPRESSION(S) / ED DIAGNOSES  Final diagnoses:  Acute pancreatitis, unspecified complication status, unspecified pancreatitis type      NEW MEDICATIONS STARTED DURING THIS VISIT:  New Prescriptions   No medications on file     Note:  This document was prepared using Dragon voice recognition software and may include unintentional dictation errors.     Merrily Brittleifenbark, Kashia Brossard, MD  12/17/16 0644  

## 2016-12-17 NOTE — ED Triage Notes (Signed)
Pt c/o epigastric pain intermittently since January, worsened Saturday around 10am; nausea with vomiting; pt has had multiple tests performed including xray, US and MRI on Friday, no known results of MRI; pt grimacing in triage; holding abd at times; last BM was Saturday morning and normal;

## 2016-12-17 NOTE — Progress Notes (Signed)
[facility] - [place] [facility]   PATIENT NAME: Dale Mendoza    MR#:  [id number]  DATE OF BIRTH:  Jul 11, 1980  SUBJECTIVE:   Pt. Here due to epigastric abdominal pain and suspected to have pancreatitis.  Lipase elevated but MRI Abdomen showing no evidence of pancreatitis or Pancreatic Pseudocyst. Still having significant abdominal pain in the epigastric area. Has had persistent nausea vomiting now for about a week.  REVIEW OF SYSTEMS:    Review of Systems  Constitutional: Negative for chills and fever.  HENT: Negative for congestion and tinnitus.   Eyes: Negative for blurred vision and double vision.  Respiratory: Negative for cough, shortness of breath and wheezing.   Cardiovascular: Negative for chest pain, orthopnea and PND.  Gastrointestinal: Positive for abdominal pain, nausea and vomiting. Negative for diarrhea.  Genitourinary: Negative for dysuria and hematuria.  Neurological: Negative for dizziness, sensory change and focal weakness.  All other systems reviewed and are negative.   Nutrition: NPO Tolerating Diet: No Tolerating PT: Ambulatory  DRUG ALLERGIES:  No Known Allergies  VITALS:  Blood pressure (!) 169/88, pulse 99, temperature 98.3 F (36.8 C), temperature source Oral, resp. rate 20, height 5\' 11"  (1.803 m), weight 107.5 kg (236 lb 14.4 oz), SpO2 100 %.  PHYSICAL EXAMINATION:   Physical Exam  GENERAL:  36 y.o.-year-old patient lying in bed in no acute distress.  EYES: Pupils equal, round, reactive to light and accommodation. No scleral icterus. Extraocular muscles intact.  HEENT: Head atraumatic, normocephalic. Oropharynx and nasopharynx clear.  NECK:  Supple, no jugular venous distention. No thyroid enlargement, no tenderness.  LUNGS: Normal breath sounds bilaterally, no wheezing, rales, rhonchi. No use of accessory muscles of respiration.  CARDIOVASCULAR: S1, S2 normal. No murmurs, rubs, or gallops.  ABDOMEN: Soft, epigastric tenderness  but no rebound rigidity, nondistended. Bowel sounds present. No organomegaly or mass.  EXTREMITIES: No cyanosis, clubbing or edema b/l.    NEUROLOGIC: Cranial nerves II through XII are intact. No focal Motor or sensory deficits b/l.   PSYCHIATRIC: The patient is alert and oriented x 3.  SKIN: No obvious rash, lesion, or ulcer.    LABORATORY PANEL:   CBC  Recent Labs Lab 12/17/16 0325  WBC 16.9*  HGB 14.6  HCT 42.3  PLT 424   ------------------------------------------------------------------------------------------------------------------  Chemistries   Recent Labs Lab 12/17/16 0325  NA 135  K 4.3  CL 97*  CO2 24  GLUCOSE 280*  BUN 8  CREATININE 0.88  CALCIUM 10.2  AST 36  ALT 35  ALKPHOS 58  BILITOT 1.1   ------------------------------------------------------------------------------------------------------------------  Cardiac Enzymes  Recent Labs Lab 12/17/16 0325  TROPONINI <0.03   ------------------------------------------------------------------------------------------------------------------  RADIOLOGY:  No results found.   ASSESSMENT AND PLAN:   36 year old male with past medical history of pancreatic Titus, hypertension, hyperlipidemia, diabetes who presents to the hospital due to epigastric abdominal pain nausea and vomiting and elevated lipase consistent with pancreatitis.  1. Acute pancreatitis-suspected cause of patient's abdominal pain nausea and vomiting. Patient's MRI of the abdomen although shows no evidence of pancreatic inflammation or any pancreatic pseudocyst. -Patient's lipase is mildly elevated. Continue supportive care with IV fluids, antiemetics and pain control. -If not improving will consider check gastroenterology consult. -Patient's pancreatitis is secondary to elevated triglycerides.  2. Diabetes type 2 without complication-continue Lantus, sliding scale insulin.  3. Hypertriglyceridemia-continue fenofibrate.  4. Essential  hypertension-continue lisinopril.     All the records are reviewed and case discussed with Care Management/Social Worker. Management plans discussed  with the patient, family and they are in agreement.  CODE STATUS: Full  DVT Prophylaxis: Lovenox  TOTAL TIME TAKING CARE OF THIS PATIENT: 30 minutes.   POSSIBLE D/C IN 1-2 DAYS, DEPENDING ON CLINICAL CONDITION.   [place] SirenSAINANI,VIVEK J M.D on 12/17/2016 at 12:22 PM  Between 7am to 6pm - Pager - [phone]  After 6pm go to [email or web address] - Social research officer, governmentpassword EPAS [facility]  [facility] [place] [facility]  Office  [phone]  CC: Primary care physician; Faythe GheeFisher, Susan W, PA-C

## 2016-12-17 NOTE — Progress Notes (Signed)
Patient is A&O x 4. Up ad lib in room. Urinating without difficulty. NPO. IV fluids running. IV pain meds with good relief. No vomiting this shift.

## 2016-12-17 NOTE — Progress Notes (Signed)
Patient is admitted to room 156 from ED. A&O x4. Up with SBA, steady gait. IV fluids started. Pain 10/10. ED gave dose of pain meds before transport. Reviewed POC and orders. Oriented to room, call light, TV and bed controls.

## 2016-12-17 NOTE — Progress Notes (Signed)
MD paged to ask about Lantus ordered for pt. Pt states does not normally take insulin at home. Pt currently NPO and also has ssi. MD ordered to hold Lantus sode this pm.

## 2016-12-18 LAB — BASIC METABOLIC PANEL
Anion gap: 7 (ref 5–15)
BUN: 7 mg/dL (ref 6–20)
CHLORIDE: 106 mmol/L (ref 101–111)
CO2: 27 mmol/L (ref 22–32)
CREATININE: 0.85 mg/dL (ref 0.61–1.24)
Calcium: 9 mg/dL (ref 8.9–10.3)
GFR calc non Af Amer: >60 mL/min (ref >60)
GLUCOSE: 158 mg/dL — AB (ref 65–99)
Potassium: 3.7 mmol/L (ref 3.5–5.1)
Sodium: 140 mmol/L (ref 135–145)

## 2016-12-18 LAB — GLUCOSE, CAPILLARY
Glucose-Capillary: 160 mg/dL — ABNORMAL HIGH (ref 65–99)
Glucose-Capillary: 169 mg/dL — ABNORMAL HIGH (ref 65–99)
Glucose-Capillary: 196 mg/dL — ABNORMAL HIGH (ref 65–99)

## 2016-12-18 LAB — LIPASE, BLOOD: Lipase: 33 U/L (ref 11–51)

## 2016-12-18 MED ORDER — METFORMIN HCL ER (OSM) 1000 MG PO TB24: 1000.0000 mg | ORAL_TABLET | Freq: Two times a day (BID) | ORAL | Status: DC

## 2016-12-18 MED ORDER — INSULIN STARTER KIT- PEN NEEDLES (ENGLISH)
1.0000 | Freq: Once | Status: AC
  Administered 2016-12-18: 1
  Filled 2016-12-18 (×2): qty 1

## 2016-12-18 MED ORDER — INSULIN PEN NEEDLE 30G X 8 MM MISC: 1.0000 | 0 refills | Status: AC | PRN

## 2016-12-18 MED ORDER — INSULIN GLARGINE 100 UNITS/ML SOLOSTAR PEN: 15.0000 [IU] | PEN_INJECTOR | Freq: Every day | SUBCUTANEOUS | 1 refills | Status: DC

## 2016-12-18 MED ORDER — INSULIN STARTER KIT- PEN NEEDLES (ENGLISH): 1.0000 | Freq: Once | 0 refills | Status: AC

## 2016-12-18 MED ORDER — ATORVASTATIN CALCIUM 40 MG PO TABS: 40.0000 mg | ORAL_TABLET | Freq: Every day | ORAL | 1 refills | Status: DC

## 2016-12-18 NOTE — Progress Notes (Signed)
DISCHARGE NOTE:  Pt given discharge instructions, work note, prescriptions, and insulin starter kit. Pt educated on insulin, pt verbalized understanding with no questions. Pt choose to ambulate out to car.

## 2016-12-18 NOTE — Progress Notes (Signed)
[facility]    Dale Mendoza was admitted to the Hospital on 12/17/2016 and Discharged  12/18/2016 and should be excused from work/school   for 3  days starting 12/17/2016 , may return to work/school without any restrictions.  Call Hilda LiasVivek Ernesteen Mihalic MD, [facility]  [phone] with questions.  [place] SirenSAINANI,Katheleen Stella J M.D on 12/18/2016,at 3:42 PM

## 2016-12-18 NOTE — Progress Notes (Signed)
[facility] - [place] [facility]   PATIENT NAME: Dale Mendoza    MR#:  [id number]  DATE OF BIRTH:  1980/12/30  SUBJECTIVE:   Abdominal pain much improved today. No nausea or vomiting.  REVIEW OF SYSTEMS:    Review of Systems  Constitutional: Negative for chills and fever.  HENT: Negative for congestion and tinnitus.   Eyes: Negative for blurred vision and double vision.  Respiratory: Negative for cough, shortness of breath and wheezing.   Cardiovascular: Negative for chest pain, orthopnea and PND.  Gastrointestinal: Positive for abdominal pain. Negative for diarrhea, nausea and vomiting.  Genitourinary: Negative for dysuria and hematuria.  Neurological: Negative for dizziness, sensory change and focal weakness.  All other systems reviewed and are negative.   Nutrition: Clear liquid Tolerating Diet: Yes Tolerating PT: Ambulatory  DRUG ALLERGIES:  No Known Allergies  VITALS:  Blood pressure 120/72, pulse 80, temperature (!) 97.5 F (36.4 C), temperature source Oral, resp. rate 18, height 5\' 11"  (1.803 m), weight 105.5 kg (232 lb 9.6 oz), SpO2 96 %.  PHYSICAL EXAMINATION:   Physical Exam  GENERAL:  36 y.o.-year-old patient lying in bed in no acute distress.  EYES: Pupils equal, round, reactive to light and accommodation. No scleral icterus. Extraocular muscles intact.  HEENT: Head atraumatic, normocephalic. Oropharynx and nasopharynx clear.  NECK:  Supple, no jugular venous distention. No thyroid enlargement, no tenderness.  LUNGS: Normal breath sounds bilaterally, no wheezing, rales, rhonchi. No use of accessory muscles of respiration.  CARDIOVASCULAR: S1, S2 normal. No murmurs, rubs, or gallops.  ABDOMEN: Soft, nontender, nondistended. Bowel sounds present. No organomegaly or mass.  EXTREMITIES: No cyanosis, clubbing or edema b/l.    NEUROLOGIC: Cranial nerves II through XII are intact. No focal Motor or sensory deficits b/l.   PSYCHIATRIC: The patient  is alert and oriented x 3.  SKIN: No obvious rash, lesion, or ulcer.    LABORATORY PANEL:   CBC  Recent Labs Lab 12/17/16 0325  WBC 16.9*  HGB 14.6  HCT 42.3  PLT 424   ------------------------------------------------------------------------------------------------------------------  Chemistries   Recent Labs Lab 12/17/16 0325 12/18/16 0355  NA 135 140  K 4.3 3.7  CL 97* 106  CO2 24 27  GLUCOSE 280* 158*  BUN 8 7  CREATININE 0.88 0.85  CALCIUM 10.2 9.0  AST 36  --   ALT 35  --   ALKPHOS 58  --   BILITOT 1.1  --    ------------------------------------------------------------------------------------------------------------------  Cardiac Enzymes  Recent Labs Lab 12/17/16 0325  TROPONINI <0.03   ------------------------------------------------------------------------------------------------------------------  RADIOLOGY:  No results found.   ASSESSMENT AND PLAN:   36 year old male with past medical history of pancreatic Titus, hypertension, hyperlipidemia, diabetes who presents to the hospital due to epigastric abdominal pain nausea and vomiting and elevated lipase consistent with pancreatitis.  1. Acute pancreatitis-suspected cause of patient's abdominal pain nausea and vomiting. Patient's MRI of the abdomen although shows no evidence of pancreatic inflammation or any pancreatic pseudocyst. -Lipase level has trended down. Abdominal pain improved. No nausea or vomiting overnight. -We'll start on clear liquids and advance as tolerated.  2. Diabetes type 2 without complication-continue Lantus, sliding scale insulin. -Blood sugar stable  3. Hypertriglyceridemia-continue fenofibrate. -We'll add high dose intensity statin.  4. Essential hypertension-continue lisinopril.  If tolerating by mouth well then possible discharge later today.  All the records are reviewed and case discussed with Care Management/Social Worker. Management plans discussed with the  patient, family and they are in agreement.  CODE STATUS: Full  DVT Prophylaxis: Lovenox  TOTAL TIME TAKING CARE OF THIS PATIENT: 30 minutes.   POSSIBLE D/C IN 1-2 DAYS, DEPENDING ON CLINICAL CONDITION.   [place] SirenSAINANI,VIVEK J M.D on 12/18/2016 at 2:37 PM  Between 7am to 6pm - Pager - [phone]  After 6pm go to [email or web address] - Social research officer, governmentpassword EPAS [facility]  [facility] [place] [facility]  Office  [phone]  CC: Primary care physician; Faythe GheeFisher, Susan W, PA-C

## 2016-12-18 NOTE — Progress Notes (Addendum)
Inpatient Diabetes Program Recommendations  AACE/ADA: New Consensus Statement on Inpatient Glycemic Control (2015)  Target Ranges:  Prepandial:   less than 140 mg/dL      Peak postprandial:   less than 180 mg/dL (1-2 hours)      Critically ill patients:  140 - 180 mg/dL   Lab Results  Component Value Date   GLUCAP 196 (H) 12/18/2016   HGBA1C 9.4 (H) 11/29/2016    Review of Glycemic ControlResults for Dale Mendoza, Dale Mendoza (MRN [id number]) as of 12/18/2016 12:30  Ref. Range 12/17/2016 11:49 12/17/2016 17:52 12/18/2016 00:04 12/18/2016 06:34 12/18/2016 11:38  Glucose-Capillary Latest Ref Range: 65 - 99 mg/dL 409215 (H) 811181 (H) 914160 (H) 169 (H) 196 (H)   Diabetes history: Type 2 diabetes Outpatient Diabetes medications: Metformin 2000 mg bid Current orders for Inpatient glycemic control:  Novolog sensitive q 6 hours  Inpatient Diabetes Program Recommendations:    Referral received.  Recommend Lantus 15 units daily.  Called and discussed with MD.  Patient likely to d/c home today.  He will need to follow-up with endocrinologist, Dr. Renae FicklePaul ASAP for titration of basal insulin. Will see pt.   Thanks, Beryl MeagerJenny Keagan Brislin, RN, BC-ADM Inpatient Diabetes Coordinator Pager [phone] (8a-5p)  Addendum 1400:  Spoke to patient regarding home insulin.  He is agreeable.  Showed him how to use insulin pen and demonstrated use.  Patient states he has used pen in the past with his father.  He was able to properly teach back insulin pen use.  Discussed importance of follow-up with Dr. Renae FicklePaul.  We discussed basal insulin and the affects on fasting blood sugars.  Discussed that goal fasting blood sugars are around 100 mg/dL.  We are also discussed increased triglycerides and importance of low CHO/ low fat diet.  He is interested in follow-up 1:1 with dietician to discuss further outpatient.  Hypoglycemia signs, symptoms and treatment discussed as well.  Patient was able to teach back.

## 2016-12-18 NOTE — Progress Notes (Signed)
Verbal order received by MD Sainaini to advance diet to clear. Order placed.

## 2016-12-18 NOTE — Progress Notes (Signed)
Thank you.  Does he have an appointment with cardiology or do we need to make one for him?

## 2016-12-19 ENCOUNTER — Telehealth: Payer: Self-pay

## 2016-12-19 LAB — HIV ANTIBODY (ROUTINE TESTING W REFLEX): HIV Screen 4th Generation wRfx: NONREACTIVE

## 2016-12-19 NOTE — Telephone Encounter (Signed)
Contacted [facility] concerning referral sent for the patient.   They stated contact was attempted once. They will retry.   Advised Brandon that the patient had been inpatient [facility] and may have missed the call.   Notified Fischer, PA of attempted contact.

## 2016-12-19 NOTE — Telephone Encounter (Signed)
-----   Message from Wyline MoodKiran Anna, MD sent at 12/17/2016  5:08 PM EDT ----- No abnormalities of the pancreas, NO tumors ,he has severe fatty liver - likely related to metabolic syndrome- suggest close follow up with cardiologist and PCP to address cardiovascular risk factors

## 2016-12-19 NOTE — Discharge Summary (Signed)
[facility] NAME: Dale Mendoza    MR#:  [id number]  DATE OF BIRTH:  Dec 26, 1980  DATE OF ADMISSION:  12/17/2016 ADMITTING PHYSICIAN: Harrie Foreman, MD  DATE OF DISCHARGE: 12/18/2016  4:29 PM  PRIMARY CARE PHYSICIAN: Versie Starks, PA-C    ADMISSION DIAGNOSIS:  Acute pancreatitis, unspecified complication status, unspecified pancreatitis type [K85.90]  DISCHARGE DIAGNOSIS:  Active Problems:   Pancreatitis   SECONDARY DIAGNOSIS:   Past Medical History:  Diagnosis Date  . Diabetes mellitus without complication ([place])   . Hyperlipidemia   . Hypertension   . Pancreatitis     HOSPITAL COURSE:   36 year old male with past medical history of pancreatic Titus, hypertension, hyperlipidemia, diabetes who presents to the hospital due to epigastric abdominal pain nausea and vomiting and elevated lipase consistent with pancreatitis.  1. Acute pancreatitis-This was the cause of patient's abdominal pain nausea vomiting. Patient was treated supportively with IV fluids, antibiotics and pain control. -The cause of patient's acute brain metastasis was secondary to uncontrolled diabetes and significant hypertriglyceridemia. -With supportive care patient's symptoms have improved. He is now being discharged home. He was strongly advised to control his diabetes being compliant with his medications. -His lipase has now normalized and he is tolerating a low-fat diet well.  2. Diabetes type 2 without complication-patient was only on metformin prior to coming to the hospital. Given his significant hypertriglyceridemia and his uncontrolled diabetes he was started on insulin. Patient was discharged on Lantus and told to continue metformin and follow up with his primary and endocrinology as an outpatient.  3. Hypertriglyceridemia-continue fenofibrate, and started on Atorvastatin upon discharge.   4. Essential hypertension- pt. Will continue  lisinopril.  DISCHARGE CONDITIONS:   Stable.   CONSULTS OBTAINED:    DRUG ALLERGIES:  No Known Allergies  DISCHARGE MEDICATIONS:   Allergies as of 12/18/2016   No Known Allergies     Medication List    TAKE these medications   albuterol 108 (90 Base) MCG/ACT inhaler Commonly known as:  PROVENTIL HFA;VENTOLIN HFA Inhale 1-2 puffs into the lungs every 6 (six) hours as needed for wheezing or shortness of breath.   atorvastatin 40 MG tablet Commonly known as:  LIPITOR Take 1 tablet (40 mg total) by mouth daily.   famotidine 40 MG tablet Commonly known as:  PEPCID Take 1 tablet (40 mg total) by mouth every evening.   fenofibrate 145 MG tablet Commonly known as:  TRICOR Take 1 tablet (145 mg total) by mouth daily. Will need fasting labs in july   fluticasone 50 MCG/ACT nasal spray Commonly known as:  FLONASE Place 1 spray into both nostrils 2 (two) times daily.   insulin glargine 100 unit/mL Sopn Commonly known as:  LANTUS Inject 0.15 mLs (15 Units total) into the skin at bedtime.   Insulin Pen Needle 30G X 8 MM Misc Commonly known as:  NOVOFINE Inject 10 each into the skin as needed.   lisinopril 10 MG tablet Commonly known as:  PRINIVIL,ZESTRIL TAKE 1 TABLET BY MOUTH TWICE DAILY   loratadine 10 MG tablet Commonly known as:  CLARITIN Take 1 tablet (10 mg total) by mouth daily.   metformin 1000 MG (OSM) 24 hr tablet Commonly known as:  FORTAMET Take 1 tablet (1,000 mg total) by mouth 2 (two) times daily with a meal.   ranitidine 150 MG tablet Commonly known as:  ZANTAC TAKE ONE TABLET TWICE A DAY   sucralfate 1 g tablet  Commonly known as:  CARAFATE Take 1 tablet (1 g total) by mouth 4 (four) times daily.     ASK your doctor about these medications   insulin starter kit- pen needles Misc 1 kit by Other route once. Ask about: Should I take this medication?         DISCHARGE INSTRUCTIONS:   DIET:  Cardiac diet and Diabetic diet  DISCHARGE  CONDITION:  Stable  ACTIVITY:  Activity as tolerated  OXYGEN:  Home Oxygen: No.   Oxygen Delivery: room air  DISCHARGE LOCATION:  home   If you experience worsening of your admission symptoms, develop shortness of breath, life threatening emergency, suicidal or homicidal thoughts you must seek medical attention immediately by calling 911 or calling your MD immediately  if symptoms less severe.  You Must read complete instructions/literature along with all the possible adverse reactions/side effects for all the Medicines you take and that have been prescribed to you. Take any new Medicines after you have completely understood and accpet all the possible adverse reactions/side effects.   Please note  You were cared for by a hospitalist during your hospital stay. If you have any questions about your discharge medications or the care you received while you were in the hospital after you are discharged, you can call the unit and asked to speak with the hospitalist on call if the hospitalist that took care of you is not available. Once you are discharged, your primary care physician will handle any further medical issues. Please note that NO REFILLS for any discharge medications will be authorized once you are discharged, as it is imperative that you return to your primary care physician (or establish a relationship with a primary care physician if you do not have one) for your aftercare needs so that they can reassess your need for medications and monitor your lab values.     Today   Abdominal pain much improved, no nausea or vomiting. Tolerating by mouth well. We'll advance diet and DC home with doing well.  VITAL SIGNS:  Blood pressure 126/78, pulse 83, temperature 98.2 F (36.8 C), temperature source Oral, resp. rate 18, height _0  (1.803 m), weight 105.5 kg (232 lb 9.6 oz), SpO2 98 %.  I/O:  No intake or output data in the 24 hours ending 12/19/16 1555  PHYSICAL EXAMINATION:   GENERAL:  36 y.o.-year-old patient lying in the bed with no acute distress.  EYES: Pupils equal, round, reactive to light and accommodation. No scleral icterus. Extraocular muscles intact.  HEENT: Head atraumatic, normocephalic. Oropharynx and nasopharynx clear.  NECK:  Supple, no jugular venous distention. No thyroid enlargement, no tenderness.  LUNGS: Normal breath sounds bilaterally, no wheezing, rales,rhonchi. No use of accessory muscles of respiration.  CARDIOVASCULAR: S1, S2 normal. No murmurs, rubs, or gallops.  ABDOMEN: Soft, non-tender, non-distended. Bowel sounds present. No organomegaly or mass.  EXTREMITIES: No pedal edema, cyanosis, or clubbing.  NEUROLOGIC: Cranial nerves II through XII are intact. No focal motor or sensory defecits b/l.  PSYCHIATRIC: The patient is alert and oriented x 3.  SKIN: No obvious rash, lesion, or ulcer.   DATA REVIEW:   CBC  Recent Labs Lab 12/17/16 0325  WBC 16.9*  HGB 14.6  HCT 42.3  PLT 424    Chemistries   Recent Labs Lab 12/17/16 0325 12/18/16 0355  NA 135 140  K 4.3 3.7  CL 97* 106  CO2 24 27  GLUCOSE 280* 158*  BUN 8 7  CREATININE 0.88 0.85  CALCIUM 10.2 9.0  AST 36  --   ALT 35  --   ALKPHOS 58  --   BILITOT 1.1  --     Cardiac Enzymes  Recent Labs Lab 12/17/16 0325  TROPONINI <0.03    Microbiology Results  Results for orders placed or performed during the hospital encounter of 09/25/15  Rapid Influenza A&B Antigens ([place] only)     Status: None   Collection Time: 09/25/15  4:06 PM  Result Value Ref Range Status   Influenza A ([place]) NEGATIVE NEGATIVE Final   Influenza B ([place]) NEGATIVE NEGATIVE Final    RADIOLOGY:  No results found.    Management plans discussed with the patient, family and they are in agreement.  CODE STATUS:  Code Status History    Date Active Date Inactive Code Status Order ID Comments User Context   12/17/2016  8:04 AM 12/18/2016  7:34 PM Full Code [id number]  Harrie Foreman, MD Inpatient      TOTAL TIME TAKING CARE OF THIS PATIENT: 40 minutes.    Henreitta Leber M.D on 12/19/2016 at 3:55 PM  Between 7am to 6pm - Pager - [phone]  After 6pm go to [email or web address] - Proofreader  [facility]  Office  [phone]  CC: Primary care physician; Versie Starks, PA-C

## 2016-12-19 NOTE — Progress Notes (Signed)
Thank you :)

## 2016-12-19 NOTE — Telephone Encounter (Signed)
Advised patient of results per Dr. Tobi BastosAnna.   No abnormalities of the pancreas, NO tumors ,he has severe fatty liver - likely related to metabolic syndrome- suggest close follow up with cardiologist and PCP to address cardiovascular risk factors

## 2016-12-21 ENCOUNTER — Ambulatory Visit (INDEPENDENT_AMBULATORY_CARE_PROVIDER_SITE_OTHER): Payer: Managed Care, Other (non HMO) | Admitting: Surgery

## 2016-12-21 ENCOUNTER — Encounter: Payer: Self-pay | Admitting: Surgery

## 2016-12-21 VITALS — BP 126/88 | HR 110 | Temp 98.0°F | Ht 71.0 in | Wt 229.0 lb

## 2016-12-21 DIAGNOSIS — K858 Other acute pancreatitis without necrosis or infection: Secondary | ICD-10-CM | POA: Diagnosis not present

## 2016-12-21 NOTE — Patient Instructions (Signed)
Please give us a call in case you have any questions or concerns.  Please follow up with Dr. Tobi BastosAnna and your primary care doctor so they could work together in lowering your Triglycerides.

## 2016-12-21 NOTE — Progress Notes (Signed)
Dale Mendoza is an 36 y.o. male.   Chief Complaint: Recurrent pancreatitis HPI: This patient with recurrent pancreatitis without frequent elevation of the liver function tests and a negative gallbladder ultrasound or studies to suggest gallbladder disease. He does have chronically severely elevated triglycerides. He was sent over by Dr. Tobi BastosAnna to evaluate for potential cholecystectomy. He does have a strong family history of gallbladder disease. He has been admitted to the hospital multiple times for this. His pain is in the epigastrium right upper quadrant and occasionally in his back  Past Medical History:  Diagnosis Date  . Allergy    seasonal  . Diabetes mellitus without complication ([facility])   . GERD (gastroesophageal reflux disease)   . Hyperlipidemia   . Hypertension   . Pancreatitis     Past Surgical History:  Procedure Laterality Date  . HERNIA REPAIR    . TONSILLECTOMY      Family History  Problem Relation Age of Onset  . Colon cancer Father   . Pancreatic cancer Paternal Grandfather    Social History:  reports that he has quit smoking. His smoking use included Cigarettes. He has never used smokeless tobacco. He reports that he drinks alcohol. He reports that he does not use drugs. He works for the [employer] and [employer] stop smoking in 2005 Allergies: No Known Allergies Strong family history of gallbladder disease  (Not in a hospital admission)   Review of Systems:   Review of Systems  Constitutional: Negative.   HENT: Negative.   Eyes: Negative.   Respiratory: Negative.   Cardiovascular: Negative.   Gastrointestinal: Positive for abdominal pain and nausea. Negative for blood in stool, constipation, diarrhea, heartburn and vomiting.  Genitourinary: Negative.   Musculoskeletal: Negative.   Skin: Negative.   Neurological: Negative.   Endo/Heme/Allergies: Negative.   Psychiatric/Behavioral: Negative.     Physical Exam:  Physical Exam   Constitutional: He is oriented to person, place, and time and well-developed, well-nourished, and in no distress. No distress.  HENT:  Head: Normocephalic and atraumatic.  Eyes: Pupils are equal, round, and reactive to light. Right eye exhibits no discharge. Left eye exhibits no discharge. No scleral icterus.  Neck: Normal range of motion.  Cardiovascular: Normal rate, regular rhythm and normal heart sounds.   Pulmonary/Chest: Effort normal and breath sounds normal. No respiratory distress. He has no wheezes. He has no rales.  Abdominal: Soft. He exhibits no distension. There is no tenderness. There is no rebound and no guarding.  Musculoskeletal: Normal range of motion. He exhibits no edema or tenderness.  Lymphadenopathy:    He has no cervical adenopathy.  Neurological: He is alert and oriented to person, place, and time.  Skin: Skin is warm and dry. No rash noted. He is not diaphoretic. No erythema.  Psychiatric: Mood and affect normal.  Vitals reviewed.   Blood pressure 126/88, pulse (!) 110, temperature 98 F (36.7 C), temperature source Oral, height 5\' 11"  (1.803 m), weight 229 lb (103.9 kg).    No results found for this or any previous visit (from the past 48 hour(s)). No results found.   Assessment/Plan A thorough review this patient's history and physical as well as prior notes and prior laboratory values and radiologic studies been performed. Gallstones of never been identified in any of his studies. His liver function tests were elevated back in 2013 but have not been elevated to any significant degree since then. He does have a fatty liver. In the absence of elevated liver  function tests but in the presence of high triglycerides and poorly controlled diabetes I spoke to Dr. Tobi BastosAnna the GI physician. We were in agreement that this patient's etiology is likely related to poorly controlled diabetes and hypertriglyceridemia and that medical management was indicated as opposed to  surgical intervention. While he has a strong family history of gallbladder disease no stones of every been suggested on studies and removal of his gallbladder at this point is not warranted. Over 50 minutes were spent counseling and studying this patient's workup to date.  Lattie Hawichard E Laria Grimmett, MD, FACS

## 2016-12-29 ENCOUNTER — Other Ambulatory Visit: Payer: Self-pay | Admitting: Physician Assistant

## 2016-12-29 NOTE — Telephone Encounter (Signed)
Test strips for glucometer rx approved

## 2017-01-09 ENCOUNTER — Other Ambulatory Visit: Payer: Self-pay | Admitting: Physician Assistant

## 2017-01-10 NOTE — Telephone Encounter (Signed)
Med refill for lisinopril approved 

## 2017-01-20 IMAGING — CT CT ABD-PELV W/ CM
1 series · 15 of 32 positions shown, 19 images · IV contrast (omnipaque)
Comparison: 12/26/2012

CLINICAL DATA: Three-day history of abdominal pain, mostly on the
left side with associated nausea and diarrhea.

EXAM:
CT ABDOMEN AND PELVIS WITH CONTRAST
TECHNIQUE: Multidetector CT imaging of the abdomen and pelvis was performed
using the standard protocol following bolus administration of
intravenous contrast.
CONTRAST:  100 mL of Omnipaque 300 intravenous contrast

[Series 2: routine abd pel with · axial · 0.80mm/px · z∈[-508,-63]mm · 15 of 100 slices shown, 19 images]
[im 7/100  soft-tissue]
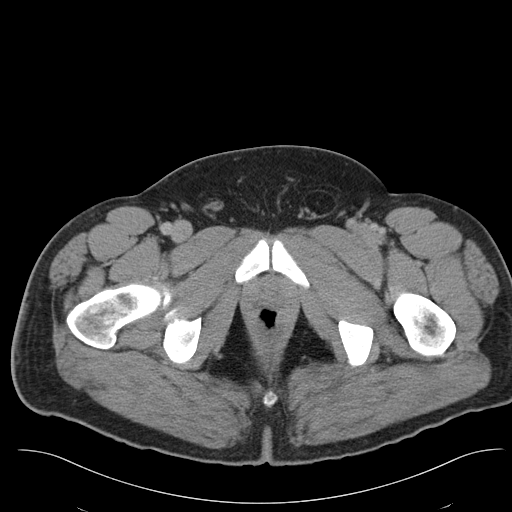
[im 7/100  bone]
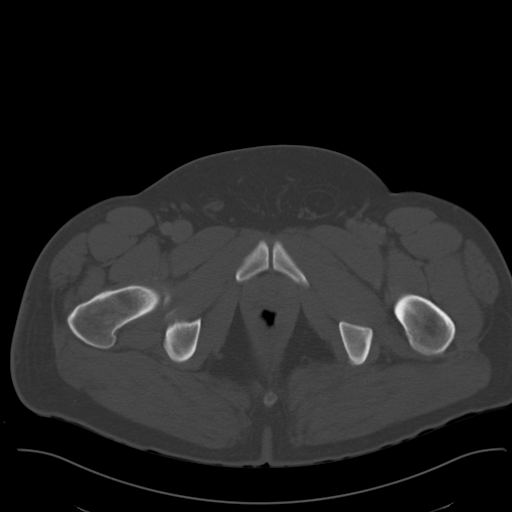
[im 13/100  soft-tissue]
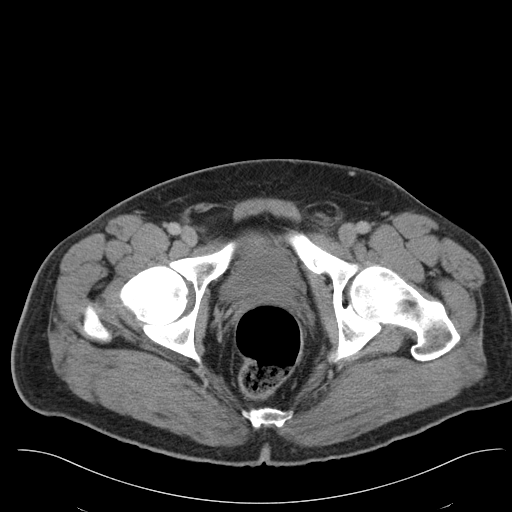
[im 20/100  soft-tissue]
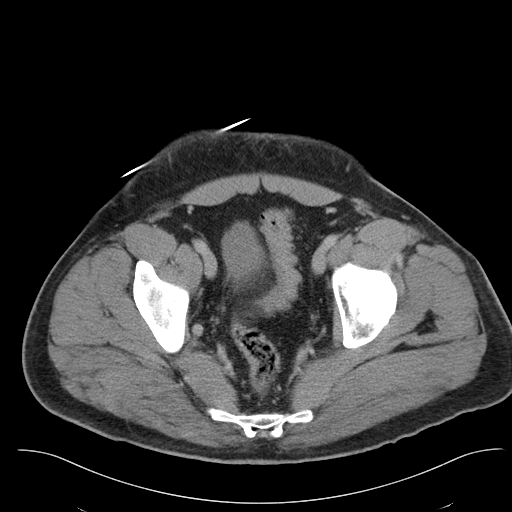
[im 29/100  soft-tissue]
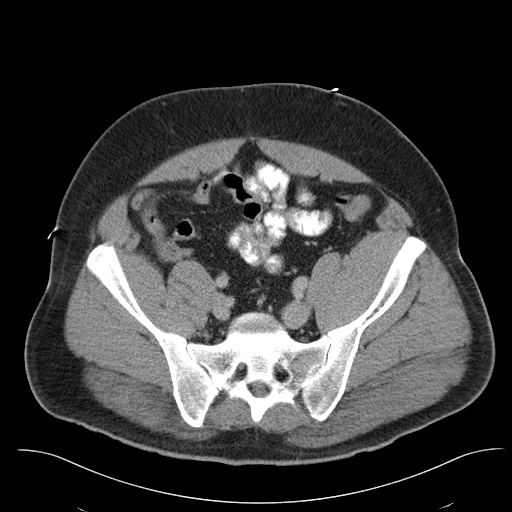
[im 36/100  soft-tissue]
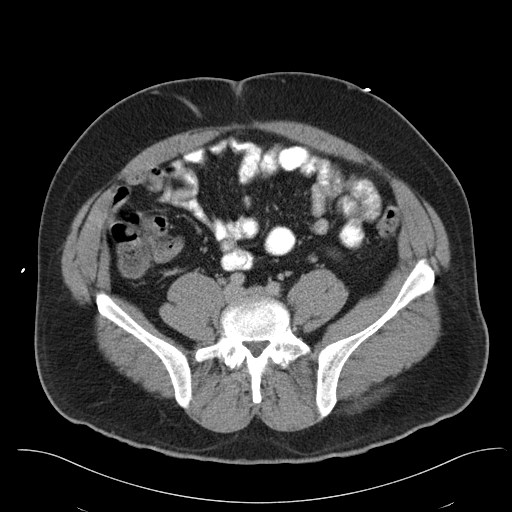
[im 42/100  soft-tissue]
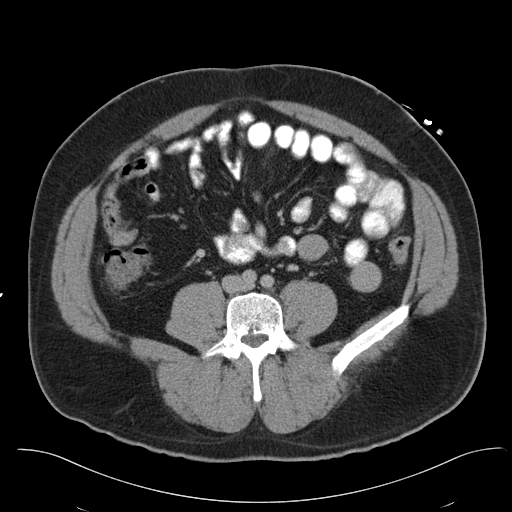
[im 52/100  soft-tissue]
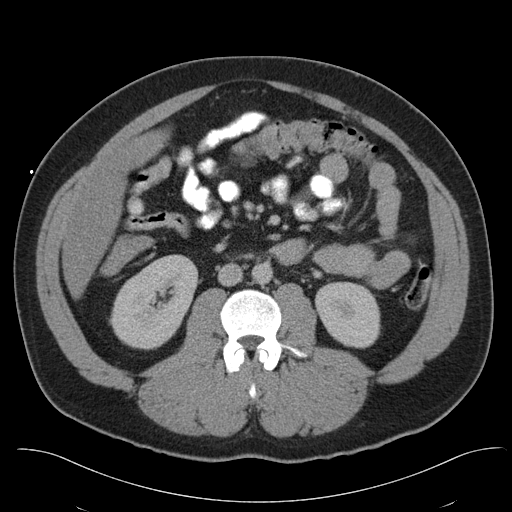
[im 58/100  soft-tissue]
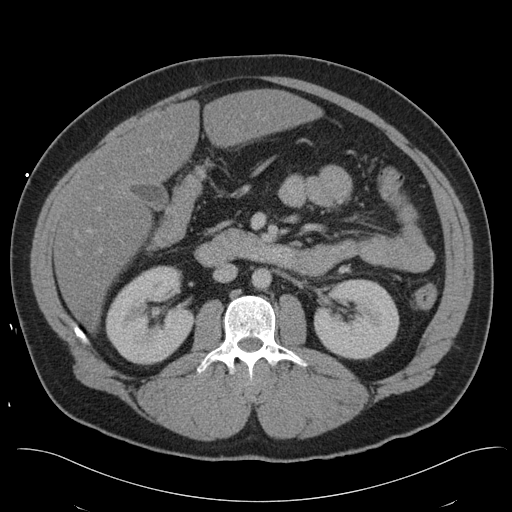
[im 64/100  soft-tissue]
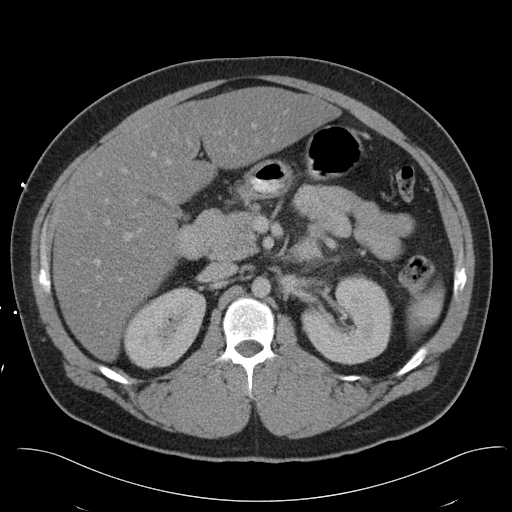
[im 64/100  bone]
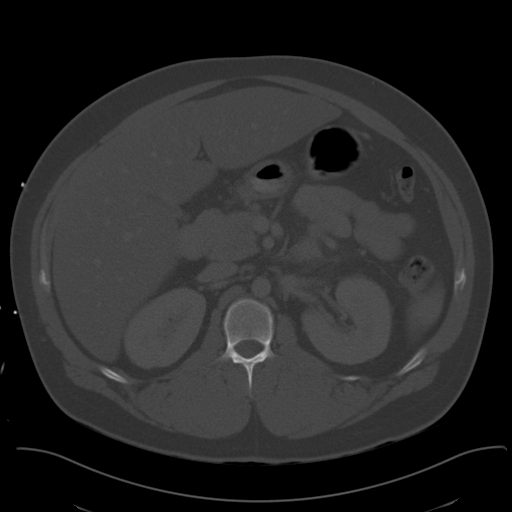
[im 71/100  soft-tissue]
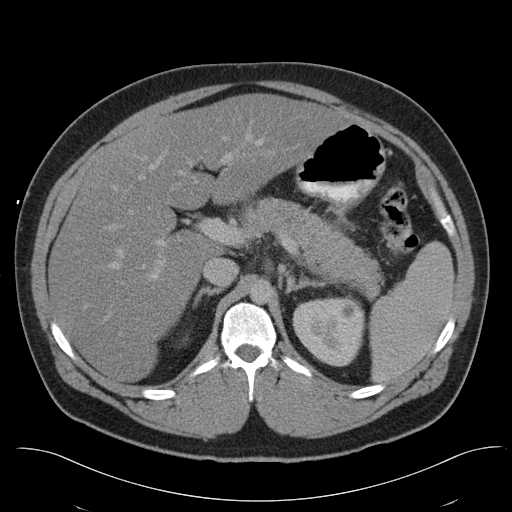
[im 80/100  soft-tissue]
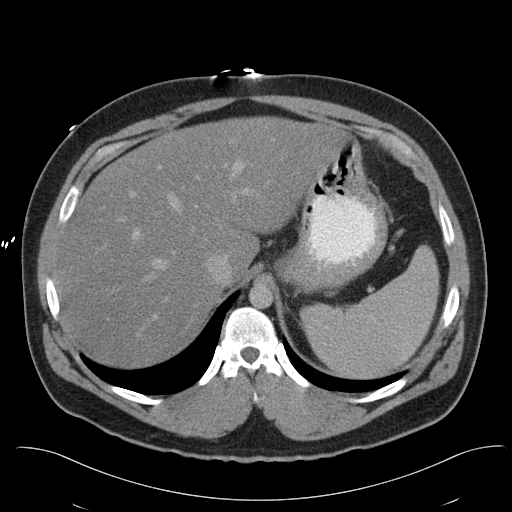
[im 87/100  soft-tissue]
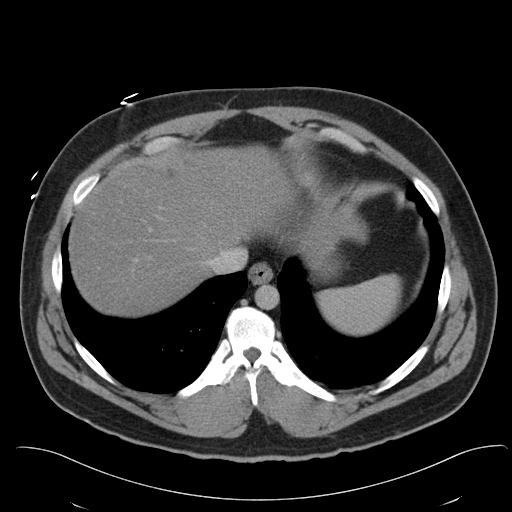
[im 87/100  lung]
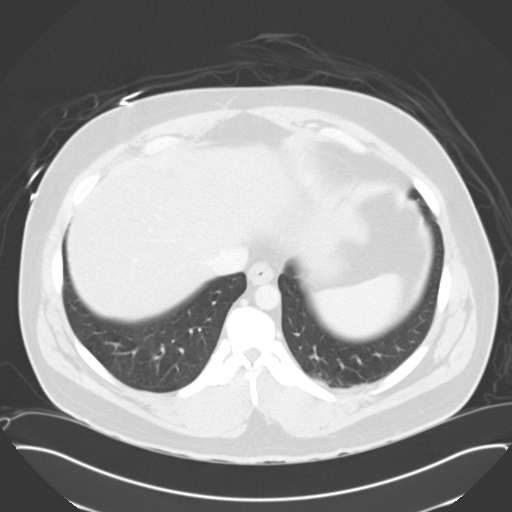
[im 90/100  lung]
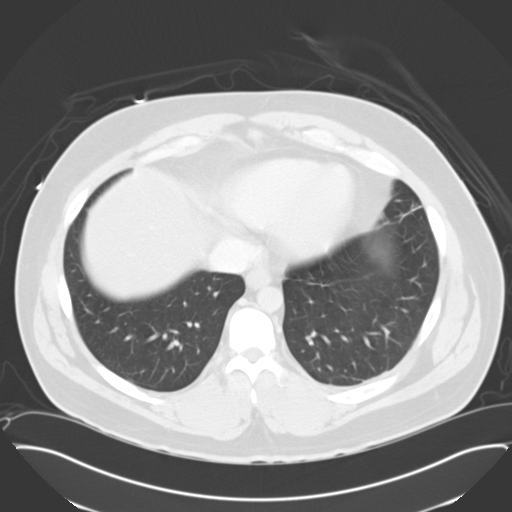
[im 93/100  soft-tissue]
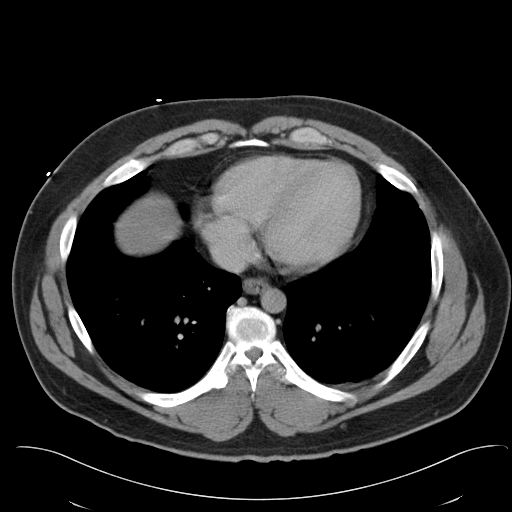
[im 93/100  lung]
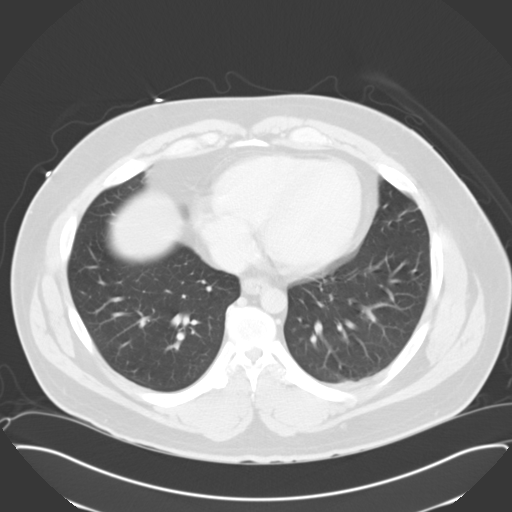
[im 96/100  lung]
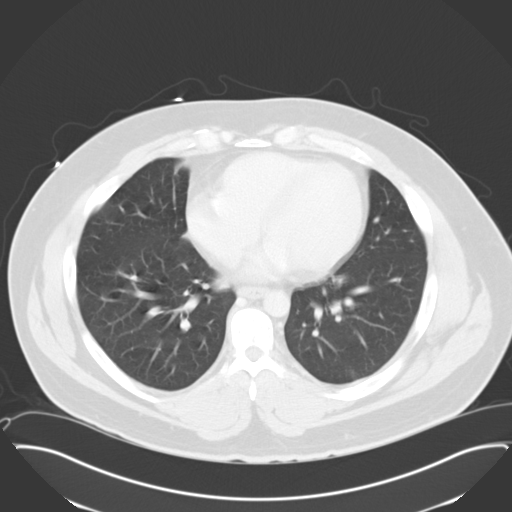

[15 of 32 positions shown; findings below may reference images not displayed]

FINDINGS: There are inflammatory changes along the tail the pancreas
consistent with mild uncomplicated pancreatitis. There is no fluid
collection to suggest an abscess or pseudocyst. The pancreas shows
homogeneous enhancement. No necrosis. Normal patency of the portal
vein, splenic vein and superior mesenteric vein.

Lung bases essentially clear.  Heart is normal in size.

There is diffuse fatty infiltration of the liver. No liver mass or
focal lesion. Liver is mildly enlarged.

Spleen, gallbladder, adrenal glands:  Normal.

12 mm upper pole left renal cyst. No other renal abnormalities. No
hydronephrosis. Normal ureters. Normal bladder.

Mildly prominent lymph nodes along the gastrohepatic ligament,
largest measuring 11 mm in short axis.

No other adenopathy.  No generalized ascites.

Unremarkable bowel.

No significant bony abnormality.
IMPRESSION: 1. Mild uncomplicated pancreatitis.
2. No other acute findings.
3. Extensive hepatic steatosis.  Mild hepatomegaly.

## 2017-02-09 ENCOUNTER — Other Ambulatory Visit: Payer: Self-pay | Admitting: Physician Assistant

## 2017-02-09 NOTE — Telephone Encounter (Signed)
Med refill for atorvastatin approved

## 2017-02-16 ENCOUNTER — Ambulatory Visit: Payer: Self-pay | Admitting: Physician Assistant

## 2017-02-16 VITALS — BP 159/100 | HR 82 | Temp 97.9°F | Resp 16

## 2017-02-16 DIAGNOSIS — Z299 Encounter for prophylactic measures, unspecified: Secondary | ICD-10-CM

## 2017-02-16 DIAGNOSIS — J0100 Acute maxillary sinusitis, unspecified: Secondary | ICD-10-CM

## 2017-02-16 MED ORDER — PREDNISONE 10 MG PO TABS: 30.0000 mg | ORAL_TABLET | Freq: Every day | ORAL | 0 refills | Status: DC

## 2017-02-16 MED ORDER — AZITHROMYCIN 250 MG PO TABS: ORAL_TABLET | ORAL | 0 refills | Status: DC

## 2017-02-16 NOTE — Addendum Note (Signed)
Addended by: Catha Brow T on: 02/16/2017 11:48 AM   Modules accepted: Orders

## 2017-02-16 NOTE — Progress Notes (Signed)
S: C/o runny nose and congestion for 3 days, no fever, chills, cp/sob, v/d; mucus is gray and thick, cough is sporadic, c/o of facial and dental pain.  Family has been sick, kids had pneumonia   Using otc meds:   O: PE: vitals wnl nad, perrl eomi, normocephalic, tms dull, nasal mucosa red and swollen, throat injected, neck supple no lymph, lungs c t a, cv rrr, neuro intact  A:  Acute sinusitis   P: drink fluids, continue regular meds , use otc meds of choice, return if not improving in 5 days, return earlier if worsening , zpack, pred  qd x 3d

## 2017-02-17 LAB — CMP12+LP+TP+TSH+6AC+CBC/D/PLT
ALT: 23 IU/L (ref 0–44)
AST: 19 IU/L (ref 0–40)
Albumin/Globulin Ratio: 2.2 (ref 1.2–2.2)
Albumin: 4.8 g/dL (ref 3.5–5.5)
Alkaline Phosphatase: 115 IU/L (ref 39–117)
BUN/Creatinine Ratio: 19 (ref 9–20)
BUN: 12 mg/dL (ref 6–20)
Basophils Absolute: 0.1 10*3/uL (ref 0.0–0.2)
Basos: 1 %
Bilirubin Total: <0.2 mg/dL (ref 0.0–1.2)
CALCIUM: 9.8 mg/dL (ref 8.7–10.2)
CHOLESTEROL TOTAL: 145 mg/dL (ref 100–199)
Chloride: 102 mmol/L (ref 96–106)
Chol/HDL Ratio: 5.8 ratio — ABNORMAL HIGH (ref 0.0–5.0)
Creatinine, Ser: 0.64 mg/dL — ABNORMAL LOW (ref 0.76–1.27)
EOS (ABSOLUTE): 0.3 10*3/uL (ref 0.0–0.4)
ESTIMATED CHD RISK: 1.2 times avg. — AB (ref 0.0–1.0)
Eos: 4 %
FREE THYROXINE INDEX: 1.8 (ref 1.2–4.9)
GFR calc Af Amer: 149 mL/min/{1.73_m2} (ref >59)
GFR calc non Af Amer: 129 mL/min/{1.73_m2} (ref >59)
GGT: 55 IU/L (ref 0–65)
GLOBULIN, TOTAL: 2.2 g/dL (ref 1.5–4.5)
GLUCOSE: 256 mg/dL — AB (ref 65–99)
HDL: 25 mg/dL — ABNORMAL LOW (ref >39)
Hematocrit: 39.4 % (ref 37.5–51.0)
Hemoglobin: 13 g/dL (ref 13.0–17.7)
IMMATURE GRANS (ABS): 0 10*3/uL (ref 0.0–0.1)
IMMATURE GRANULOCYTES: 0 %
IRON: 57 ug/dL (ref 38–169)
LDH: 130 IU/L (ref 121–224)
LYMPHS ABS: 2.5 10*3/uL (ref 0.7–3.1)
LYMPHS: 33 %
MCH: 28 pg (ref 26.6–33.0)
MCHC: 33 g/dL (ref 31.5–35.7)
MCV: 85 fL (ref 79–97)
MONOS ABS: 0.5 10*3/uL (ref 0.1–0.9)
Monocytes: 6 %
NEUTROS ABS: 4.3 10*3/uL (ref 1.4–7.0)
NEUTROS PCT: 56 %
PHOSPHORUS: 3.4 mg/dL (ref 2.5–4.5)
POTASSIUM: 4.4 mmol/L (ref 3.5–5.2)
Platelets: 417 10*3/uL — ABNORMAL HIGH (ref 150–379)
RBC: 4.64 x10E6/uL (ref 4.14–5.80)
RDW: 14.2 % (ref 12.3–15.4)
SODIUM: 143 mmol/L (ref 134–144)
T3 Uptake Ratio: 29 % (ref 24–39)
T4 TOTAL: 6.1 ug/dL (ref 4.5–12.0)
TOTAL PROTEIN: 7 g/dL (ref 6.0–8.5)
TRIGLYCERIDES: 723 mg/dL — AB (ref 0–149)
TSH: 1.92 u[IU]/mL (ref 0.450–4.500)
Uric Acid: 3.8 mg/dL (ref 3.7–8.6)
WBC: 7.7 10*3/uL (ref 3.4–10.8)

## 2017-02-21 LAB — SPECIMEN STATUS REPORT

## 2017-02-21 LAB — HGB A1C W/O EAG: HEMOGLOBIN A1C: 9.3 % — AB (ref 4.8–5.6)

## 2017-05-08 ENCOUNTER — Encounter: Payer: Self-pay | Admitting: Emergency Medicine

## 2017-05-08 ENCOUNTER — Other Ambulatory Visit: Payer: Self-pay

## 2017-05-08 ENCOUNTER — Emergency Department
Admission: EM | Admit: 2017-05-08 | Discharge: 2017-05-08 | Disposition: A | Discharge status: Home / Self Care | Payer: Managed Care, Other (non HMO) | Attending: Emergency Medicine | Admitting: Emergency Medicine

## 2017-05-08 DIAGNOSIS — I10 Essential (primary) hypertension: Secondary | ICD-10-CM | POA: Diagnosis not present

## 2017-05-08 DIAGNOSIS — Z87891 Personal history of nicotine dependence: Secondary | ICD-10-CM | POA: Insufficient documentation

## 2017-05-08 DIAGNOSIS — Z79899 Other long term (current) drug therapy: Secondary | ICD-10-CM | POA: Diagnosis not present

## 2017-05-08 DIAGNOSIS — Z794 Long term (current) use of insulin: Secondary | ICD-10-CM | POA: Insufficient documentation

## 2017-05-08 DIAGNOSIS — R1013 Epigastric pain: Secondary | ICD-10-CM | POA: Diagnosis not present

## 2017-05-08 DIAGNOSIS — E119 Type 2 diabetes mellitus without complications: Secondary | ICD-10-CM | POA: Diagnosis not present

## 2017-05-08 DIAGNOSIS — K859 Acute pancreatitis without necrosis or infection, unspecified: Secondary | ICD-10-CM | POA: Insufficient documentation

## 2017-05-08 LAB — COMPREHENSIVE METABOLIC PANEL
ALT: 30 U/L (ref 17–63)
AST: 34 U/L (ref 15–41)
Albumin: 4.4 g/dL (ref 3.5–5.0)
Alkaline Phosphatase: 65 U/L (ref 38–126)
Anion gap: 12 (ref 5–15)
BILIRUBIN TOTAL: 0.6 mg/dL (ref 0.3–1.2)
BUN: 16 mg/dL (ref 6–20)
CHLORIDE: 98 mmol/L — AB (ref 101–111)
CO2: 25 mmol/L (ref 22–32)
CREATININE: 0.69 mg/dL (ref 0.61–1.24)
Calcium: 10.3 mg/dL (ref 8.9–10.3)
GFR calc Af Amer: >60 mL/min (ref >60)
GFR calc non Af Amer: >60 mL/min (ref >60)
GLUCOSE: 243 mg/dL — AB (ref 65–99)
POTASSIUM: 3.6 mmol/L (ref 3.5–5.1)
SODIUM: 135 mmol/L (ref 135–145)
TOTAL PROTEIN: 7.3 g/dL (ref 6.5–8.1)

## 2017-05-08 LAB — CBC
HEMATOCRIT: 42.7 % (ref 40.0–52.0)
Hemoglobin: 14.3 g/dL (ref 13.0–18.0)
MCH: 27.6 pg (ref 26.0–34.0)
MCHC: 33.5 g/dL (ref 32.0–36.0)
MCV: 82.3 fL (ref 80.0–100.0)
PLATELETS: 314 10*3/uL (ref 150–440)
RBC: 5.2 MIL/uL (ref 4.40–5.90)
RDW: 12.8 % (ref 11.5–14.5)
WBC: 13.2 10*3/uL — ABNORMAL HIGH (ref 3.8–10.6)

## 2017-05-08 LAB — URINALYSIS, COMPLETE (UACMP) WITH MICROSCOPIC
BACTERIA UA: NONE SEEN
BILIRUBIN URINE: NEGATIVE
Glucose, UA: >=500 mg/dL — AB
HGB URINE DIPSTICK: NEGATIVE
KETONES UR: 5 mg/dL — AB
Leukocytes, UA: NEGATIVE
Nitrite: NEGATIVE
PROTEIN: 30 mg/dL — AB
SPECIFIC GRAVITY, URINE: 1.025 (ref 1.005–1.030)
pH: 5 (ref 5.0–8.0)

## 2017-05-08 LAB — ETHANOL: Alcohol, Ethyl (B): <10 mg/dL (ref <10)

## 2017-05-08 LAB — TROPONIN I: Troponin I: <0.03 ng/mL (ref <0.03)

## 2017-05-08 LAB — LIPASE, BLOOD: LIPASE: 67 U/L — AB (ref 11–51)

## 2017-05-08 MED ORDER — ONDANSETRON HCL 4 MG/2ML IJ SOLN
4.0000 mg | Freq: Once | INTRAMUSCULAR | Status: AC
  Administered 2017-05-08: 4 mg via INTRAVENOUS
  Filled 2017-05-08: qty 2

## 2017-05-08 MED ORDER — ONDANSETRON 4 MG PO TBDP: 4.0000 mg | ORAL_TABLET | Freq: Three times a day (TID) | ORAL | 0 refills | Status: DC | PRN

## 2017-05-08 MED ORDER — SODIUM CHLORIDE 0.9 % IV BOLUS (SEPSIS)
1000.0000 mL | Freq: Once | INTRAVENOUS | Status: AC
Start: 2017-05-08 — End: 2017-05-08
  Administered 2017-05-08: 1000 mL via INTRAVENOUS

## 2017-05-08 MED ORDER — HYDROMORPHONE HCL 1 MG/ML IJ SOLN
0.5000 mg | Freq: Once | INTRAMUSCULAR | Status: AC
  Administered 2017-05-08: 0.5 mg via INTRAVENOUS
  Filled 2017-05-08: qty 1

## 2017-05-08 MED ORDER — OXYCODONE-ACETAMINOPHEN 5-325 MG PO TABS: 1.0000 | ORAL_TABLET | ORAL | 0 refills | Status: DC | PRN

## 2017-05-08 NOTE — ED Triage Notes (Signed)
Patient ambulatory to triage with steady gait, without difficulty or distress noted; pt reports since Sunday having mid epigastric pain, nonradiating with no accomp symptoms; st hx of same with pancreatitis

## 2017-05-08 NOTE — ED Notes (Signed)
Patient is calling his wife to pick him up

## 2017-05-08 NOTE — ED Provider Notes (Signed)
[facility] Emergency Department Provider Note   ____________________________________________   First MD Initiated Contact with Patient 05/08/17 [id number]     (approximate)  I have reviewed the triage vital signs and the nursing notes.   HISTORY  Chief Complaint Abdominal Pain    HPI Dale Mendoza is a 36 y.o. male who presents to the ED from home with a chief complaint of abdominal pain.  Patient reports epigastric pain x 1 day, not associated with fever/chills, nausea/vomiting.  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  Denies alcohol use.  Denies chest pain, shortness of breath, dysuria.  Notes several loose stools.,  Denies recent travel, trauma or antibiotic use.  Past Medical History:  Diagnosis Date  . Allergy    seasonal  . Diabetes mellitus without complication (HCC)   . GERD (gastroesophageal reflux disease)   . Hyperlipidemia   . Hypertension   . Pancreatitis     Patient Active Problem List   Diagnosis Date Noted  . Pancreatitis 12/17/2016    Past Surgical History:  Procedure Laterality Date  . HERNIA REPAIR    . TONSILLECTOMY      Prior to Admission medications   Medication Sig Start Date End Date Taking? Authorizing Provider  albuterol (PROVENTIL HFA;VENTOLIN HFA) 108 (90 Base) MCG/ACT inhaler Inhale 1-2 puffs into the lungs every 6 (six) hours as needed for wheezing or shortness of breath. 10/04/15   Sherrie Mustache Roselyn Bering, PA-C  atorvastatin (LIPITOR) 40 MG tablet TAKE 1 TABLET BY MOUTH DAILY 02/09/17   Sherrie Mustache Roselyn Bering, PA-C  azithromycin (ZITHROMAX Z-PAK) 250 MG tablet 2 pills today then 1 pill a day for 4 days 02/16/17   Sherrie Mustache Roselyn Bering, PA-C  famotidine (PEPCID) 40 MG tablet Take 1 tablet (40 mg total) by mouth every evening. 12/02/16 12/02/17  Phineas Semen, MD  fenofibrate (TRICOR) 145 MG tablet Take 1 tablet (145 mg total) by mouth daily. Will need fasting labs in july 12/06/16   Fisher, Roselyn Bering,  PA-C  fluticasone [facility]) 50 MCG/ACT nasal spray Place 1 spray into both nostrils 2 (two) times daily. 10/04/15   Fisher, Roselyn Bering, PA-C  insulin glargine (LANTUS) 100 unit/mL SOPN Inject 0.15 mLs (15 Units total) into the skin at bedtime. 12/18/16   Houston Siren, MD  Insulin Pen Needle (NOVOFINE) 30G X 8 MM MISC Inject 10 each into the skin as needed. 12/18/16   Houston Siren, MD  lisinopril (PRINIVIL,ZESTRIL) 10 MG tablet TAKE 1 TABLET BY MOUTH TWICE DAILY 01/10/17   Sherrie Mustache Roselyn Bering, PA-C  loratadine (CLARITIN) 10 MG tablet Take 1 tablet (10 mg total) by mouth daily. 07/21/15   Betancourt, Jarold Song, NP  metformin (FORTAMET) 1000 MG (OSM) 24 hr tablet Take 1 tablet (1,000 mg total) by mouth 2 (two) times daily with a meal. 12/18/16   Houston Siren, MD  ONE TOUCH ULTRA TEST test strip USE AS DIRECTED THREE TIMES A DAY 12/29/16   Sherrie Mustache, Roselyn Bering, PA-C  predniSONE (DELTASONE) 10 MG tablet Take 3 tablets (30 mg total) by mouth daily with breakfast. 02/16/17   Sherrie Mustache Roselyn Bering, PA-C  ranitidine (ZANTAC) 150 MG tablet TAKE ONE TABLET TWICE A DAY 07/17/16   Sherrie Mustache Roselyn Bering, PA-C  sucralfate (CARAFATE) 1 g tablet Take 1 tablet (1 g total) by mouth 4 (four) times daily. 12/02/16   Phineas Semen, MD    Allergies Patient has no known allergies.  Family History  Problem Relation Age of  Onset  . Colon cancer Father   . Pancreatic cancer Paternal Grandfather     Social History Social History   Tobacco Use  . Smoking status: Former Smoker    Types: Cigarettes  . Smokeless tobacco: Never Used  Substance Use Topics  . Alcohol use: Yes    Alcohol/week: 0.0 oz  . Drug use: No    Review of Systems  Constitutional: No fever/chills. Eyes: No visual changes. ENT: No sore throat. Cardiovascular: Denies chest pain. Respiratory: Denies shortness of breath. Gastrointestinal: Positive for abdominal pain.  No nausea, no vomiting.  No diarrhea.  No constipation. Genitourinary: Negative for  dysuria. Musculoskeletal: Negative for back pain. Skin: Negative for rash. Neurological: Negative for headaches, focal weakness or numbness.   ____________________________________________   PHYSICAL EXAM:  VITAL SIGNS: ED Triage Vitals  Enc Vitals Group     BP 05/08/17 0046 (!) 149/90     Pulse Rate 05/08/17 0046 (!) 103     Resp 05/08/17 0046 20     Temp 05/08/17 0046 98.6 F (37 C)     Temp Source 05/08/17 0046 Oral     SpO2 05/08/17 0046 98 %     Weight 05/08/17 0046 240 lb (108.9 kg)     Height 05/08/17 0046 6' (1.829 m)     Head Circumference --      Peak Flow --      Pain Score 05/08/17 0048 10     Pain Loc --      Pain Edu? --      Excl. in GC? --     Constitutional: Alert and oriented. Well appearing and in no acute distress. Eyes: Conjunctivae are normal. PERRL. EOMI. Head: Atraumatic. Nose: No congestion/rhinnorhea. Mouth/Throat: Mucous membranes are moist.  Oropharynx non-erythematous. Neck: No stridor.   Cardiovascular: Normal rate, regular rhythm. Grossly normal heart sounds.  Good peripheral circulation. Respiratory: Normal respiratory effort.  No retractions. Lungs CTAB. Gastrointestinal: Soft and mildly tender to palpation epigastrium without rebound or guarding. No distention. No abdominal bruits. No CVA tenderness. Musculoskeletal: No lower extremity tenderness nor edema.  No joint effusions. Neurologic:  Normal speech and language. No gross focal neurologic deficits are appreciated. No gait instability. Skin:  Skin is warm, dry and intact. No rash noted. Psychiatric: Mood and affect are normal. Speech and behavior are normal.  ____________________________________________   LABS (all labs ordered are listed, but only abnormal results are displayed)  Labs Reviewed  LIPASE, BLOOD - Abnormal; Notable for the following components:      Result Value   Lipase 67 (*)    All other components within normal limits  COMPREHENSIVE METABOLIC PANEL -  Abnormal; Notable for the following components:   Chloride 98 (*)    Glucose, Bld 243 (*)    All other components within normal limits  CBC - Abnormal; Notable for the following components:   WBC 13.2 (*)    All other components within normal limits  URINALYSIS, COMPLETE (UACMP) WITH MICROSCOPIC - Abnormal; Notable for the following components:   Color, Urine YELLOW (*)    APPearance CLEAR (*)    Glucose, UA >=500 (*)    Ketones, ur 5 (*)    Protein, ur 30 (*)    Squamous Epithelial / LPF 0-5 (*)    All other components within normal limits  TROPONIN I  ETHANOL   ____________________________________________  EKG  ED ECG REPORT I, Normon Pettijohn J, the attending physician, personally viewed and interpreted this ECG.   Date: 05/08/2017  EKG Time: 0052  Rate: 103  Rhythm: sinus tachycardia  Axis: Normal  Intervals:none  ST&T Change: Nonspecific  ____________________________________________  RADIOLOGY  No results found.  ____________________________________________   PROCEDURES  Procedure(s) performed: None  Procedures  Critical Care performed: No  ____________________________________________   INITIAL IMPRESSION / ASSESSMENT AND PLAN / ED COURSE  As part of my medical decision making, I reviewed the following data within the electronic MEDICAL RECORD NUMBER Nursing notes reviewed and incorporated, Labs reviewed, EKG interpreted, Old chart reviewed and Notes from prior ED visits.   36 year old male with recurrent pancreatitis who presents with epigastric pain. Differential diagnosis includes, but is not limited to, biliary disease (biliary colic, acute cholecystitis, cholangitis, choledocholithiasis, etc), intrathoracic causes for epigastric abdominal pain including ACS, gastritis, duodenitis, pancreatitis, small bowel or large bowel obstruction, abdominal aortic aneurysm, hernia, and gastritis.  Laboratory and urinalysis results remarkable for mildly elevated lipase,  mild leukocytosis, hyperglycemia without anion gap elevation.  Will initiate IV fluid resuscitation, IV analgesia with antiemetic, and reassess.  Clinical Course as of May 08 617  Tue May 08, 2017  0618 Pain improved.  Awaiting ride back home.  Will discharge home on Percocet and Zofran, and patient will follow-up closely with his PCP this week.  Strict return precautions given.  Patient verbalizes understanding and agrees with plan of care.  [JS]    Clinical Course User Index [JS] Irean HongSung, Kylil Swopes J, MD     ____________________________________________   FINAL CLINICAL IMPRESSION(S) / ED DIAGNOSES  Final diagnoses:  Epigastric pain  Acute pancreatitis without infection or necrosis, unspecified pancreatitis type     ED Discharge Orders    None       Note:  This document was prepared using Dragon voice recognition software and may include unintentional dictation errors.    Irean HongSung, Zuriel Yeaman J, MD 05/08/17 [phone]

## 2017-05-08 NOTE — Discharge Instructions (Signed)
1. Take pain & nausea medicines as needed (Percocet/Zofran #20). 2. Drink plenty of fluids daily. Avoid fatty, greasy, spicy foods and alcohol. 3. Return to the ER for worsening symptoms, persistent vomiting, difficulty breathing or other concerns.

## 2017-06-06 ENCOUNTER — Ambulatory Visit: Payer: Self-pay | Admitting: Registered Nurse

## 2017-06-06 VITALS — BP 130/80 | HR 101 | Temp 98.0°F | Resp 16

## 2017-06-06 DIAGNOSIS — K859 Acute pancreatitis without necrosis or infection, unspecified: Secondary | ICD-10-CM

## 2017-06-06 DIAGNOSIS — B349 Viral infection, unspecified: Secondary | ICD-10-CM

## 2017-06-06 DIAGNOSIS — J0101 Acute recurrent maxillary sinusitis: Secondary | ICD-10-CM

## 2017-06-06 DIAGNOSIS — IMO0002 Reserved for concepts with insufficient information to code with codable children: Secondary | ICD-10-CM

## 2017-06-06 MED ORDER — ACETAMINOPHEN 500 MG PO TABS: 1000.0000 mg | ORAL_TABLET | Freq: Four times a day (QID) | ORAL | 0 refills | Status: AC | PRN

## 2017-06-06 MED ORDER — ONDANSETRON HCL 4 MG PO TABS: 4.0000 mg | ORAL_TABLET | Freq: Two times a day (BID) | ORAL | 0 refills | Status: AC | PRN

## 2017-06-06 MED ORDER — SALINE SPRAY 0.65 % NA SOLN: 2.0000 | NASAL | 0 refills | Status: DC

## 2017-06-06 MED ORDER — AMOXICILLIN-POT CLAVULANATE 875-125 MG PO TABS: 1.0000 | ORAL_TABLET | Freq: Two times a day (BID) | ORAL | 0 refills | Status: DC

## 2017-06-06 NOTE — Patient Instructions (Signed)
Viral Gastroenteritis, Adult Viral gastroenteritis is also known as the stomach flu. This condition is caused by various viruses. These viruses can be passed from person to person very easily (are very contagious). This condition may affect your stomach, small intestine, and large intestine. It can cause sudden watery diarrhea, fever, and vomiting. Diarrhea and vomiting can make you feel weak and cause you to become dehydrated. You may not be able to keep fluids down. Dehydration can make you tired and thirsty, cause you to have a dry mouth, and decrease how often you urinate. Older adults and people with other diseases or a weak immune system are at higher risk for dehydration. It is important to replace the fluids that you lose from diarrhea and vomiting. If you become severely dehydrated, you may need to get fluids through an IV tube. What are the causes? Gastroenteritis is caused by various viruses, including rotavirus and norovirus. Norovirus is the most common cause in adults. You can get sick by eating food, drinking water, or touching a surface contaminated with one of these viruses. You can also get sick from sharing utensils or other personal items with an infected person. What increases the risk? This condition is more likely to develop in people:  Who have a weak defense system (immune system).  Who live with one or more children who are younger than 2 years old.  Who live in a nursing home.  Who go on cruise ships.  What are the signs or symptoms? Symptoms of this condition start suddenly 1-2 days after exposure to a virus. Symptoms may last a few days or as long as a week. The most common symptoms are watery diarrhea and vomiting. Other symptoms include:  Fever.  Headache.  Fatigue.  Pain in the abdomen.  Chills.  Weakness.  Nausea.  Muscle aches.  Loss of appetite.  How is this diagnosed? This condition is diagnosed with a medical history and physical exam. You  may also have a stool test to check for viruses or other infections. How is this treated? This condition typically goes away on its own. The focus of treatment is to restore lost fluids (rehydration). Your health care provider may recommend that you take an oral rehydration solution (ORS) to replace important salts and minerals (electrolytes) in your body. Severe cases of this condition may require giving fluids through an IV tube. Treatment may also include medicine to help with your symptoms. Follow these instructions at home: Follow instructions from your health care provider about how to care for yourself at home. Eating and drinking Follow these recommendations as told by your health care provider:  Take an ORS. This is a drink that is sold at pharmacies and retail stores.  Drink clear fluids in small amounts as you are able. Clear fluids include water, ice chips, diluted fruit juice, and low-calorie sports drinks.  Eat bland, easy-to-digest foods in small amounts as you are able. These foods include bananas, applesauce, rice, lean meats, toast, and crackers.  Avoid fluids that contain a lot of sugar or caffeine, such as energy drinks, sports drinks, and soda.  Avoid alcohol.  Avoid spicy or fatty foods.  General instructions   Drink enough fluid to keep your urine clear or pale yellow.  Wash your hands often. If soap and water are not available, use hand sanitizer.  Make sure that all people in your household wash their hands well and often.  Take over-the-counter and prescription medicines only as told by your health   care provider.  Rest at home while you recover.  Watch your condition for any changes.  Take a warm bath to relieve any burning or pain from frequent diarrhea episodes.  Keep all follow-up visits as told by your health care provider. This is important. Contact a health care provider if:  You cannot keep fluids down.  Your symptoms get worse.  You have  new symptoms.  You feel light-headed or dizzy.  You have muscle cramps. Get help right away if:  You have chest pain.  You feel extremely weak or you faint.  You see blood in your vomit.  Your vomit looks like coffee grounds.  You have bloody or black stools or stools that look like tar.  You have a severe headache, a stiff neck, or both.  You have a rash.  You have severe pain, cramping, or bloating in your abdomen.  You have trouble breathing or you are breathing very quickly.  Your heart is beating very quickly.  Your skin feels cold and clammy.  You feel confused.  You have pain when you urinate.  You have signs of dehydration, such as: ? Dark urine, very little urine, or no urine. ? Cracked lips. ? Dry mouth. ? Sunken eyes. ? Sleepiness. ? Weakness. This information is not intended to replace advice given to you by your health care provider. Make sure you discuss any questions you have with your health care provider. Document Released: 05/08/2005 Document Revised: 10/20/2015 Document Reviewed: 01/12/2015 Elsevier Interactive Patient Education  2018 [employer] Sinus Rinse What is a sinus rinse? A sinus rinse is a simple home treatment that is used to rinse your sinuses with a sterile mixture of salt and water (saline solution). Sinuses are air-filled spaces in your skull behind the bones of your face and forehead that open into your nasal cavity. You will use the following:  Saline solution.  Neti pot or spray bottle. This releases the saline solution into your nose and through your sinuses. Neti pots and spray bottles can be purchased at [employer], a health food store, or online.  When would I do a sinus rinse? A sinus rinse can help to clear mucus, dirt, dust, or pollen from the nasal cavity. You may do a sinus rinse when you have a cold, a virus, nasal allergy symptoms, a sinus infection, or stuffiness in the nose or sinuses. If you are  considering a sinus rinse:  Ask your child's health care provider before performing a sinus rinse on your child.  Do not do a sinus rinse if you have had ear or nasal surgery, ear infection, or blocked ears.  How do I do a sinus rinse?  Wash your hands.  Disinfect your device according to the directions provided and then dry it.  Use the solution that comes with your device or one that is sold separately in stores. Follow the mixing directions on the package.  Fill your device with the amount of saline solution as directed by the device instructions.  Stand over a sink and tilt your head sideways over the sink.  Place the spout of the device in your upper nostril (the one closer to the ceiling).  Gently pour or squeeze the saline solution into the nasal cavity. The liquid should drain to the lower nostril if you are not overly congested.  Gently blow your nose. Blowing too hard may cause ear pain.  Repeat in the other nostril.  Clean and rinse your device with  clean water and then air-dry it. Are there risks of a sinus rinse? Sinus rinse is generally very safe and effective. However, there are a few risks, which include:  A burning sensation in the sinuses. This may happen if you do not make the saline solution as directed. Make sure to follow all directions when making the saline solution.  Infection from contaminated water. This is rare, but possible.  Nasal irritation.  This information is not intended to replace advice given to you by your health care provider. Make sure you discuss any questions you have with your health care provider. Document Released: 12/03/2013 Document Revised: 04/04/2016 Document Reviewed: 09/23/2013 Elsevier Interactive Patient Education  2017 [employer] Sinusitis, Adult Sinusitis is soreness and inflammation of your sinuses. Sinuses are hollow spaces in the bones around your face. Your sinuses are located:  Around your eyes.  In the middle  of your forehead.  Behind your nose.  In your cheekbones.  Your sinuses and nasal passages are lined with a stringy fluid (mucus). Mucus normally drains out of your sinuses. When your nasal tissues become inflamed or swollen, the mucus can become trapped or blocked so air cannot flow through your sinuses. This allows bacteria, viruses, and funguses to grow, which leads to infection. Sinusitis can develop quickly and last for 7?10 days (acute) or for more than 12 weeks (chronic). Sinusitis often develops after a cold. What are the causes? This condition is caused by anything that creates swelling in the sinuses or stops mucus from draining, including:  Allergies.  Asthma.  Bacterial or viral infection.  Abnormally shaped bones between the nasal passages.  Nasal growths that contain mucus (nasal polyps).  Narrow sinus openings.  Pollutants, such as chemicals or irritants in the air.  A foreign object stuck in the nose.  A fungal infection. This is rare.  What increases the risk? The following factors may make you more likely to develop this condition:  Having allergies or asthma.  Having had a recent cold or respiratory tract infection.  Having structural deformities or blockages in your nose or sinuses.  Having a weak immune system.  Doing a lot of swimming or diving.  Overusing nasal sprays.  Smoking.  What are the signs or symptoms? The main symptoms of this condition are pain and a feeling of pressure around the affected sinuses. Other symptoms include:  Upper toothache.  Earache.  Headache.  Bad breath.  Decreased sense of smell and taste.  A cough that may get worse at night.  Fatigue.  Fever.  Thick drainage from your nose. The drainage is often green and it may contain pus (purulent).  Stuffy nose or congestion.  Postnasal drip. This is when extra mucus collects in the throat or back of the nose.  Swelling and warmth over the affected  sinuses.  Sore throat.  Sensitivity to light.  How is this diagnosed? This condition is diagnosed based on symptoms, a medical history, and a physical exam. To find out if your condition is acute or chronic, your health care provider may:  Look in your nose for signs of nasal polyps.  Tap over the affected sinus to check for signs of infection.  View the inside of your sinuses using an imaging device that has a light attached (endoscope).  If your health care provider suspects that you have chronic sinusitis, you may also:  Be tested for allergies.  Have a sample of mucus taken from your nose (nasal culture) and checked for  bacteria.  Have a mucus sample examined to see if your sinusitis is related to an allergy.  If your sinusitis does not respond to treatment and it lasts longer than 8 weeks, you may have an MRI or CT scan to check your sinuses. These scans also help to determine how severe your infection is. In rare cases, a bone biopsy may be done to rule out more serious types of fungal sinus disease. How is this treated? Treatment for sinusitis depends on the cause and whether your condition is chronic or acute. If a virus is causing your sinusitis, your symptoms will go away on their own within 10 days. You may be given medicines to relieve your symptoms, including:  Topical nasal decongestants. They shrink swollen nasal passages and let mucus drain from your sinuses.  Antihistamines. These drugs block inflammation that is triggered by allergies. This can help to ease swelling in your nose and sinuses.  Topical nasal corticosteroids. These are nasal sprays that ease inflammation and swelling in your nose and sinuses.  Nasal saline washes. These rinses can help to get rid of thick mucus in your nose.  If your condition is caused by bacteria, you will be given an antibiotic medicine. If your condition is caused by a fungus, you will be given an antifungal medicine. Surgery  may be needed to correct underlying conditions, such as narrow nasal passages. Surgery may also be needed to remove polyps. Follow these instructions at home: Medicines  Take, use, or apply over-the-counter and prescription medicines only as told by your health care provider. These may include nasal sprays.  If you were prescribed an antibiotic medicine, take it as told by your health care provider. Do not stop taking the antibiotic even if you start to feel better. Hydrate and Humidify  Drink enough water to keep your urine clear or pale yellow. Staying hydrated will help to thin your mucus.  Use a cool mist humidifier to keep the humidity level in your home above 50%.  Inhale steam for 10-15 minutes, 3-4 times a day or as told by your health care provider. You can do this in the bathroom while a hot shower is running.  Limit your exposure to cool or dry air. Rest  Rest as much as possible.  Sleep with your head raised (elevated).  Make sure to get enough sleep each night. General instructions  Apply a warm, moist washcloth to your face 3-4 times a day or as told by your health care provider. This will help with discomfort.  Wash your hands often with soap and water to reduce your exposure to viruses and other germs. If soap and water are not available, use hand sanitizer.  Do not smoke. Avoid being around people who are smoking (secondhand smoke).  Keep all follow-up visits as told by your health care provider. This is important. Contact a health care provider if:  You have a fever.  Your symptoms get worse.  Your symptoms do not improve within 10 days. Get help right away if:  You have a severe headache.  You have persistent vomiting.  You have pain or swelling around your face or eyes.  You have vision problems.  You develop confusion.  Your neck is stiff.  You have trouble breathing. This information is not intended to replace advice given to you by your  health care provider. Make sure you discuss any questions you have with your health care provider. Document Released: 05/08/2005 Document Revised: 01/02/2016 Document Reviewed:  03/03/2015 Elsevier Interactive Patient Education  2018 [employer] Viral Illness, Adult Viruses are tiny germs that can get into a person's body and cause illness. There are many different types of viruses, and they cause many types of illness. Viral illnesses can range from mild to severe. They can affect various parts of the body. Common illnesses that are caused by a virus include colds and the flu. Viral illnesses also include serious conditions such as HIV/AIDS (human immunodeficiency virus/acquired immunodeficiency syndrome). A few viruses have been linked to certain cancers. What are the causes? Many types of viruses can cause illness. Viruses invade cells in your body, multiply, and cause the infected cells to malfunction or die. When the cell dies, it releases more of the virus. When this happens, you develop symptoms of the illness, and the virus continues to spread to other cells. If the virus takes over the function of the cell, it can cause the cell to divide and grow out of control, as is the case when a virus causes cancer. Different viruses get into the body in different ways. You can get a virus by:  Swallowing food or water that is contaminated with the virus.  Breathing in droplets that have been coughed or sneezed into the air by an infected person.  Touching a surface that has been contaminated with the virus and then touching your eyes, nose, or mouth.  Being bitten by an insect or animal that carries the virus.  Having sexual contact with a person who is infected with the virus.  Being exposed to blood or fluids that contain the virus, either through an open cut or during a transfusion.  If a virus enters your body, your body's defense system (immune system) will try to fight the virus. You may  be at higher risk for a viral illness if your immune system is weak. What are the signs or symptoms? Symptoms vary depending on the type of virus and the location of the cells that it invades. Common symptoms of the main types of viral illnesses include: Cold and flu viruses  Fever.  Headache.  Sore throat.  Muscle aches.  Nasal congestion.  Cough. Digestive system (gastrointestinal) viruses  Fever.  Abdominal pain.  Nausea.  Diarrhea. Liver viruses (hepatitis)  Loss of appetite.  Tiredness.  Yellowing of the skin (jaundice). Brain and spinal cord viruses  Fever.  Headache.  Stiff neck.  Nausea and vomiting.  Confusion or sleepiness. Skin viruses  Warts.  Itching.  Rash. Sexually transmitted viruses  Discharge.  Swelling.  Redness.  Rash. How is this treated? Viruses can be difficult to treat because they live within cells. Antibiotic medicines do not treat viruses because these drugs do not get inside cells. Treatment for a viral illness may include:  Resting and drinking plenty of fluids.  Medicines to relieve symptoms. These can include over-the-counter medicine for pain and fever, medicines for cough or congestion, and medicines to relieve diarrhea.  Antiviral medicines. These drugs are available only for certain types of viruses. They may help reduce flu symptoms if taken early. There are also many antiviral medicines for hepatitis and HIV/AIDS.  Some viral illnesses can be prevented with vaccinations. A common example is the flu shot. Follow these instructions at home: Medicines   Take over-the-counter and prescription medicines only as told by your health care provider.  If you were prescribed an antiviral medicine, take it as told by your health care provider. Do not stop taking the medicine even  if you start to feel better.  Be aware of when antibiotics are needed and when they are not needed. Antibiotics do not treat viruses. If  your health care provider thinks that you may have a bacterial infection as well as a viral infection, you may get an antibiotic. ? Do not ask for an antibiotic prescription if you have been diagnosed with a viral illness. That will not make your illness go away faster. ? Frequently taking antibiotics when they are not needed can lead to antibiotic resistance. When this develops, the medicine no longer works against the bacteria that it normally fights. General instructions  Drink enough fluids to keep your urine clear or pale yellow.  Rest as much as possible.  Return to your normal activities as told by your health care provider. Ask your health care provider what activities are safe for you.  Keep all follow-up visits as told by your health care provider. This is important. How is this prevented? Take these actions to reduce your risk of viral infection:  Eat a healthy diet and get enough rest.  Wash your hands often with soap and water. This is especially important when you are in public places. If soap and water are not available, use hand sanitizer.  Avoid close contact with friends and family who have a viral illness.  If you travel to areas where viral gastrointestinal infection is common, avoid drinking water or eating raw food.  Keep your immunizations up to date. Get a flu shot every year as told by your health care provider.  Do not share toothbrushes, nail clippers, razors, or needles with other people.  Always practice safe sex.  Contact a health care provider if:  You have symptoms of a viral illness that do not go away.  Your symptoms come back after going away.  Your symptoms get worse. Get help right away if:  You have trouble breathing.  You have a severe headache or a stiff neck.  You have severe vomiting or abdominal pain. This information is not intended to replace advice given to you by your health care provider. Make sure you discuss any questions  you have with your health care provider. Document Released: 09/17/2015 Document Revised: 10/20/2015 Document Reviewed: 09/17/2015 Elsevier Interactive Patient Education  [employer].

## 2017-06-06 NOTE — Progress Notes (Signed)
Subjective:    Patient ID: Dale Mendoza, male    DOB: Sep 28, 1980, 37 y.o.   MRN: [id number]  36y/o caucasian male established patient still [facility] as requested consult by PA FIsher and needs updated diabetes labs and follow up appt booked with [facility] by CMA Aldona Lentoeacon.  Here for not feeling well sinus/ears/sore throat and nausea.  Needs refill on zofran has used in past without side effects.  Sick coworkers family healthy.  Was in hospital again for pancreatitis last month.  Blood sugars stable but changes to his medications stopped glipizide and trulicity now on lantus.  Had been feeling okay nausea does not feel like usual pancreatitis symptoms to him.  Denied diarrhea/vomiting/blood in stool.  Sick contacts at work.  Blood sugars have been his usual.      Review of Systems  Constitutional: Negative for activity change, appetite change, chills, diaphoresis, fatigue, fever and unexpected weight change.  HENT: Positive for congestion, ear pain, postnasal drip, rhinorrhea, sinus pressure, sinus pain and sore throat. Negative for dental problem, drooling, ear discharge, facial swelling, hearing loss, mouth sores, nosebleeds, sneezing, tinnitus, trouble swallowing and voice change.   Eyes: Negative for photophobia, pain, discharge, redness, itching and visual disturbance.  Respiratory: Positive for cough. Negative for choking, chest tightness, shortness of breath, wheezing and stridor.   Cardiovascular: Negative for chest pain, palpitations and leg swelling.  Gastrointestinal: Positive for nausea. Negative for abdominal distention, abdominal pain, blood in stool, constipation, diarrhea and vomiting.  Endocrine: Negative for cold intolerance and heat intolerance.  Genitourinary: Negative for dysuria.  Musculoskeletal: Negative for arthralgias, back pain, gait problem, joint swelling, myalgias, neck pain and neck stiffness.  Skin: Negative for color change, pallor, rash  and wound.  Allergic/Immunologic: Positive for environmental allergies. Negative for food allergies and immunocompromised state.  Neurological: Negative for dizziness, tremors, seizures, syncope, facial asymmetry, speech difficulty, weakness, light-headedness, numbness and headaches.  Hematological: Negative for adenopathy. Does not bruise/bleed easily.  Psychiatric/Behavioral: Negative for agitation, behavioral problems, confusion and sleep disturbance.       Objective:   Physical Exam  Constitutional: He is oriented to person, place, and time. Vital signs are normal. He appears well-developed and well-nourished. He is active and cooperative.  Non-toxic appearance. He does not have a sickly appearance. He appears ill. No distress.  HENT:  Head: Normocephalic and atraumatic.  Right Ear: Hearing, external ear and ear canal normal. A middle ear effusion is present.  Left Ear: Hearing, external ear and ear canal normal. A middle ear effusion is present.  Nose: Mucosal edema and rhinorrhea present. No nose lacerations, sinus tenderness, nasal deformity, septal deviation or nasal septal hematoma. No epistaxis.  No foreign bodies. Right sinus exhibits maxillary sinus tenderness. Right sinus exhibits no frontal sinus tenderness. Left sinus exhibits maxillary sinus tenderness. Left sinus exhibits no frontal sinus tenderness.  Mouth/Throat: Uvula is midline and mucous membranes are normal. Mucous membranes are not pale, not dry and not cyanotic. He does not have dentures. No oral lesions. No trismus in the jaw. Normal dentition. No dental abscesses, uvula swelling, lacerations or dental caries. Posterior oropharyngeal edema and posterior oropharyngeal erythema present. No oropharyngeal exudate or tonsillar abscesses.  Cobblestoning posterior pharynx; bilateral TMs air fluid level clear; bilateral allergic shiners; bilateral nasal turbinates edema/erythema  Eyes: Conjunctivae, EOM and lids are normal. Pupils  are equal, round, and reactive to light. Right eye exhibits no chemosis, no discharge, no exudate and no hordeolum. No foreign  body present in the right eye. Left eye exhibits no chemosis, no discharge, no exudate and no hordeolum. No foreign body present in the left eye. Right conjunctiva is not injected. Right conjunctiva has no hemorrhage. Left conjunctiva is not injected. Left conjunctiva has no hemorrhage. No scleral icterus. Right eye exhibits normal extraocular motion and no nystagmus. Left eye exhibits normal extraocular motion and no nystagmus. Right pupil is round and reactive. Left pupil is round and reactive. Pupils are equal.  Neck: Trachea normal, normal range of motion and phonation normal. Neck supple. No tracheal tenderness and no muscular tenderness present. No neck rigidity. No tracheal deviation, no edema, no erythema and normal range of motion present. No thyroid mass and no thyromegaly present.  Cardiovascular: Normal rate, regular rhythm, S1 normal, S2 normal, normal heart sounds and intact distal pulses. PMI is not displaced. Exam reveals no gallop and no friction rub.  No murmur heard. Pulmonary/Chest: Effort normal and breath sounds normal. No accessory muscle usage or stridor. No respiratory distress. He has no decreased breath sounds. He has no wheezes. He has no rhonchi. He has no rales.  No cough observed in exam room; spoke full sentences without difficulty  Abdominal: Soft. Normal appearance. He exhibits no shifting dullness, no distension, no pulsatile liver, no fluid wave, no abdominal bruit, no ascites, no pulsatile midline mass and no mass. Bowel sounds are decreased. There is no hepatosplenomegaly. There is no tenderness. There is no rigidity, no rebound, no guarding, no CVA tenderness, no tenderness at McBurney's point and negative Murphy's sign. Hernia confirmed negative in the ventral area.  Hypoactive bowel sounds x 4 quads; dull to percussion x 4 quads   Musculoskeletal: Normal range of motion. He exhibits no edema or tenderness.       Right shoulder: Normal.       Left shoulder: Normal.       Right elbow: Normal.      Left elbow: Normal.       Right hip: Normal.       Left hip: Normal.       Right knee: Normal.       Left knee: Normal.       Cervical back: Normal.       Thoracic back: Normal.       Lumbar back: Normal.       Right hand: Normal.       Left hand: Normal.  Lymphadenopathy:       Head (right side): No submental, no submandibular, no tonsillar, no preauricular, no posterior auricular and no occipital adenopathy present.       Head (left side): No submental, no submandibular, no tonsillar, no preauricular, no posterior auricular and no occipital adenopathy present.    He has no cervical adenopathy.       Right cervical: No superficial cervical, no deep cervical and no posterior cervical adenopathy present.      Left cervical: No superficial cervical, no deep cervical and no posterior cervical adenopathy present.  Neurological: He is alert and oriented to person, place, and time. He has normal strength. He is not disoriented. He displays no atrophy and no tremor. No cranial nerve deficit or sensory deficit. He exhibits normal muscle tone. He displays no seizure activity. Coordination and gait normal. GCS eye subscore is 4. GCS verbal subscore is 5. GCS motor subscore is 6.  On/off exam table without difficulty; gait sure and steady in hallway  Skin: Skin is warm, dry and intact. No abrasion,  no bruising, no burn, no ecchymosis, no laceration, no lesion, no petechiae and no rash noted. He is not diaphoretic. No cyanosis or erythema. No pallor. Nails show no clubbing.  Psychiatric: He has a normal mood and affect. His speech is normal and behavior is normal. Judgment and thought content normal. Cognition and memory are normal.  Nursing note and vitals reviewed.         Assessment & Plan:  A-viral illness, maxillary  sinusitis, history of recurrent pancreatitis, diabetes mellitus  P-Restart flonase 1 spray each nostril BID #1 RF0, saline 2 sprays each nostril q2h wa prn congestion OTC.  If no improvement with 48 hours of saline and flonase use start augmentin 875mg  po BID x 10 days #20 RF0.  Electronic Rx given to his pharmacy of choice.  Denied personal or family history of ENT cancer.  Shower BID especially prior to bed. No evidence of systemic bacterial infection, non toxic and well hydrated.  I do not see where any further testing or imaging is necessary at this time.   I will suggest supportive care, rest, good hygiene and encourage the patient to take adequate fluids.  The patient is to return to clinic or EMERGENCY ROOM if symptoms worsen or change significantly.  Exitcare handout on sinusitis and sinus rinse given to patient.  Patient verbalized agreement and understanding of treatment plan and had no further questions at this time.   P2:  Hand washing and cover cough  Refilled zofran 4mg  po BID prn nausea #6 RF0, bland diet  Work excuse x 48 hours return 06/08/2017.  I have recommended clear fluids and bland diet. Avoid dairy/spicy, fried and large portions of meat while having nausea. If vomiting hold po intake x 1 hour. Then sips clear fluids like broths, ginger ale, power ade, gatorade, pedialyte may advance to soft/bland if no vomiting x 24 hours and appetite returned otherwise hydration main focus. Return to the clinic if symptoms persist or worsen; I have alerted the patient to call if high fever, dehydration, marked weakness, fainting, increased abdominal pain, blood in stool or vomit (red or black). Exitcare handout on gastroenteritis given to patient. Patient verbalized agreement and understanding of treatment plan and had no further questions at this time.   [facility] endocrinology follow up to be scheduled by CMA Deacon, fasting labs drawn today follow up pancreatitis.  Continue medications as  prescribed avoid alcohol intake.  Healthy dietary choices. Encouraged to exercise 150 minutes per week, weight loss and wear supportive shoes. Moisturize skin to keep healthy and prevent sores/breakdown. Exitcare handout on pancreatitis given to patient.  Patient verbalized understanding of information/instructions/ agreed with plan of care and had no further questions at this time.

## 2017-06-07 LAB — CMP12+LP+TP+TSH+6AC+CBC/D/PLT
ALBUMIN: 5.2 g/dL (ref 3.5–5.5)
ALT: 22 IU/L (ref 0–44)
AST: 16 IU/L (ref 0–40)
Albumin/Globulin Ratio: 1.9 (ref 1.2–2.2)
Alkaline Phosphatase: 73 IU/L (ref 39–117)
BASOS: 0 %
BUN/Creatinine Ratio: 20 (ref 9–20)
BUN: 16 mg/dL (ref 6–20)
Basophils Absolute: 0 10*3/uL (ref 0.0–0.2)
Bilirubin Total: 0.7 mg/dL (ref 0.0–1.2)
CALCIUM: 10.4 mg/dL — AB (ref 8.7–10.2)
CHOL/HDL RATIO: 4.1 ratio (ref 0.0–5.0)
CHOLESTEROL TOTAL: 174 mg/dL (ref 100–199)
Chloride: 97 mmol/L (ref 96–106)
Creatinine, Ser: 0.82 mg/dL (ref 0.76–1.27)
EOS (ABSOLUTE): 0.1 10*3/uL (ref 0.0–0.4)
ESTIMATED CHD RISK: 0.8 times avg. (ref 0.0–1.0)
Eos: 1 %
FREE THYROXINE INDEX: 2 (ref 1.2–4.9)
GFR calc Af Amer: 133 mL/min/{1.73_m2} (ref >59)
GFR calc non Af Amer: 115 mL/min/{1.73_m2} (ref >59)
GGT: 79 IU/L — AB (ref 0–65)
GLOBULIN, TOTAL: 2.7 g/dL (ref 1.5–4.5)
Glucose: 225 mg/dL — ABNORMAL HIGH (ref 65–99)
HDL: 42 mg/dL (ref >39)
Hematocrit: 42.6 % (ref 37.5–51.0)
Hemoglobin: 14.7 g/dL (ref 13.0–17.7)
IMMATURE GRANS (ABS): 0 10*3/uL (ref 0.0–0.1)
Immature Granulocytes: 0 %
Iron: 39 ug/dL (ref 38–169)
LDH: 136 IU/L (ref 121–224)
LYMPHS: 19 %
Lymphocytes Absolute: 2.5 10*3/uL (ref 0.7–3.1)
MCH: 28.9 pg (ref 26.6–33.0)
MCHC: 34.5 g/dL (ref 31.5–35.7)
MCV: 84 fL (ref 79–97)
Monocytes Absolute: 1.1 10*3/uL — ABNORMAL HIGH (ref 0.1–0.9)
Monocytes: 9 %
NEUTROS ABS: 9.4 10*3/uL — AB (ref 1.4–7.0)
Neutrophils: 71 %
PHOSPHORUS: 4 mg/dL (ref 2.5–4.5)
POTASSIUM: 4.3 mmol/L (ref 3.5–5.2)
Platelets: 291 10*3/uL (ref 150–379)
RBC: 5.08 x10E6/uL (ref 4.14–5.80)
RDW: 13.6 % (ref 12.3–15.4)
SODIUM: 140 mmol/L (ref 134–144)
T3 Uptake Ratio: 30 % (ref 24–39)
T4 TOTAL: 6.6 ug/dL (ref 4.5–12.0)
TRIGLYCERIDES: 581 mg/dL — AB (ref 0–149)
TSH: 2.86 u[IU]/mL (ref 0.450–4.500)
Total Protein: 7.9 g/dL (ref 6.0–8.5)
Uric Acid: 4.3 mg/dL (ref 3.7–8.6)
WBC: 13.2 10*3/uL — AB (ref 3.4–10.8)

## 2017-06-07 LAB — HGB A1C W/O EAG: HEMOGLOBIN A1C: 10.4 % — AB (ref 4.8–5.6)

## 2017-07-20 ENCOUNTER — Ambulatory Visit: Payer: Self-pay | Admitting: Medical

## 2017-07-20 VITALS — BP 130/80 | HR 95 | Temp 97.7°F | Resp 18

## 2017-07-20 DIAGNOSIS — R059 Cough, unspecified: Secondary | ICD-10-CM

## 2017-07-20 DIAGNOSIS — Z760 Encounter for issue of repeat prescription: Secondary | ICD-10-CM

## 2017-07-20 DIAGNOSIS — K219 Gastro-esophageal reflux disease without esophagitis: Secondary | ICD-10-CM

## 2017-07-20 DIAGNOSIS — J301 Allergic rhinitis due to pollen: Secondary | ICD-10-CM

## 2017-07-20 DIAGNOSIS — R05 Cough: Secondary | ICD-10-CM

## 2017-07-20 DIAGNOSIS — J069 Acute upper respiratory infection, unspecified: Secondary | ICD-10-CM

## 2017-07-20 MED ORDER — BENZONATATE 100 MG PO CAPS: 100.0000 mg | ORAL_CAPSULE | Freq: Three times a day (TID) | ORAL | 0 refills | Status: DC | PRN

## 2017-07-20 MED ORDER — RANITIDINE HCL 150 MG PO TABS: 150.0000 mg | ORAL_TABLET | Freq: Every day | ORAL | 2 refills | Status: DC

## 2017-07-20 MED ORDER — DOXYCYCLINE HYCLATE 100 MG PO TABS: 100.0000 mg | ORAL_TABLET | Freq: Two times a day (BID) | ORAL | 0 refills | Status: DC

## 2017-07-20 MED ORDER — FLUTICASONE PROPIONATE 50 MCG/ACT NA SUSP: 2.0000 | Freq: Every day | NASAL | 6 refills | Status: DC

## 2017-07-20 NOTE — Progress Notes (Signed)
   Subjective:    Patient ID: Dale Mendoza, male    DOB: [birth year], 37 y.o.   MRN: [id number]  HPI 37 yo in non acute distress with cold symptoms x 2 weeks. Complains of productive cough and nasal discharge that is green. Denies any fever or chills , chest pain or shortness of breath. Uses albuterol inhaler during haying season in the summer, has not had to use it for now. Has tried OTC mucinex and cold and flu treatments and Robitussin all which have not really helped.   Review of Systems  Constitutional: Negative for chills and fever.  HENT: Positive for congestion, sinus pressure, sinus pain and voice change (early this week but not resolved.). Negative for ear pain and sore throat.   Respiratory: Positive for cough. Negative for chest tightness, shortness of breath and wheezing.   Cardiovascular: Negative for chest pain.  Gastrointestinal: Negative for abdominal pain.  Genitourinary: Negative for dysuria.  Skin: Negative for rash.  Allergic/Immunologic: Positive for environmental allergies and immunocompromised state.  Neurological: Positive for headaches (at bridge of nose). Negative for dizziness, syncope and light-headedness.  Hematological: Negative for adenopathy.       Objective:   Physical Exam  Constitutional: He is oriented to person, place, and time. He appears well-developed and well-nourished.  HENT:  Head: Normocephalic and atraumatic.  Right Ear: External ear normal.  Left Ear: External ear normal.  Eyes: Conjunctivae and EOM are normal. Pupils are equal, round, and reactive to light.  Cardiovascular: Normal rate, regular rhythm and normal heart sounds.  Pulmonary/Chest: Effort normal. He has wheezes (left upper and lower on expiration only mild).  Lymphadenopathy:    He has no cervical adenopathy.  Neurological: He is alert and oriented to person, place, and time.  Skin: Skin is warm and dry.  Psychiatric: He has a normal mood and affect. His behavior is normal.  Judgment and thought content normal.          Assessment & Plan:  Sinusitis, Upper Respiratory infection/ cough Refills for Fluticasone and Zantac. Use Albuterol as prescribed. Meds ordered this encounter  Medications  . doxycycline (VIBRA-TABS) 100 MG tablet    Sig: Take 1 tablet (100 mg total) by mouth 2 (two) times daily.    Dispense:  20 tablet    Refill:  0  . benzonatate (TESSALON) 100 MG capsule    Sig: Take 1 capsule (100 mg total) by mouth 3 (three) times daily as needed for cough.    Dispense:  30 capsule    Refill:  0  . fluticasone (FLONASE) 50 MCG/ACT nasal spray    Sig: Place 2 sprays into both nostrils daily.    Dispense:  16 g    Refill:  6  . ranitidine (ZANTAC) 150 MG tablet    Sig: Take 1 tablet (150 mg total) by mouth at bedtime.    Dispense:  30 tablet    Refill:  2   Return in 3-5 days if not improving. Patient verbalizes understanding and has no questions at discharge.

## 2017-07-20 NOTE — Patient Instructions (Signed)
Gastroesophageal Reflux Disease, Adult Normally, food travels down the esophagus and stays in the stomach to be digested. If a person has gastroesophageal reflux disease (GERD), food and stomach acid move back up into the esophagus. When this happens, the esophagus becomes sore and swollen (inflamed). Over time, GERD can make small holes (ulcers) in the lining of the esophagus. Follow these instructions at home: Diet  Follow a diet as told by your doctor. You may need to avoid foods and drinks such as: ? Coffee and tea (with or without caffeine). ? Drinks that contain alcohol. ? Energy drinks and sports drinks. ? Carbonated drinks or sodas. ? Chocolate and cocoa. ? Peppermint and mint flavorings. ? Garlic and onions. ? Horseradish. ? Spicy and acidic foods, such as peppers, chili powder, curry powder, vinegar, hot sauces, and BBQ sauce. ? Citrus fruit juices and citrus fruits, such as oranges, lemons, and limes. ? Tomato-based foods, such as red sauce, chili, salsa, and pizza with red sauce. ? Fried and fatty foods, such as donuts, french fries, potato chips, and high-fat dressings. ? High-fat meats, such as hot dogs, rib eye steak, sausage, ham, and bacon. ? High-fat dairy items, such as whole milk, butter, and cream cheese.  Eat small meals often. Avoid eating large meals.  Avoid drinking large amounts of liquid with your meals.  Avoid eating meals during the 2-3 hours before bedtime.  Avoid lying down right after you eat.  Do not exercise right after you eat. General instructions  Pay attention to any changes in your symptoms.  Take over-the-counter and prescription medicines only as told by your doctor. Do not take aspirin, ibuprofen, or other NSAIDs unless your doctor says it is okay.  Do not use any tobacco products, including cigarettes, chewing tobacco, and e-cigarettes. If you need help quitting, ask your doctor.  Wear loose clothes. Do not wear anything tight around  your waist.  Raise (elevate) the head of your bed about 6 inches (15 cm).  Try to lower your stress. If you need help doing this, ask your doctor.  If you are overweight, lose an amount of weight that is healthy for you. Ask your doctor about a safe weight loss goal.  Keep all follow-up visits as told by your doctor. This is important. Contact a doctor if:  You have new symptoms.  You lose weight and you do not know why it is happening.  You have trouble swallowing, or it hurts to swallow.  You have wheezing or a cough that keeps happening.  Your symptoms do not get better with treatment.  You have a hoarse voice. Get help right away if:  You have pain in your arms, neck, jaw, teeth, or back.  You feel sweaty, dizzy, or light-headed.  You have chest pain or shortness of breath.  You throw up (vomit) and your throw up looks like blood or coffee grounds.  You pass out (faint).  Your poop (stool) is bloody or black.  You cannot swallow, drink, or eat. This information is not intended to replace advice given to you by your health care provider. Make sure you discuss any questions you have with your health care provider. Document Released: 10/25/2007 Document Revised: 10/14/2015 Document Reviewed: 09/02/2014 Elsevier Interactive Patient Education  2018 [employer]. Sinusitis, Adult Sinusitis is soreness and inflammation of your sinuses. Sinuses are hollow spaces in the bones around your face. They are located:  Around your eyes.  In the middle of your forehead.  Behind your  nose.  In your cheekbones.  Your sinuses and nasal passages are lined with a stringy fluid (mucus). Mucus normally drains out of your sinuses. When your nasal tissues get inflamed or swollen, the mucus can get trapped or blocked so air cannot flow through your sinuses. This lets bacteria, viruses, and funguses grow, and that leads to infection. Follow these instructions at home: Medicines  Take,  use, or apply over-the-counter and prescription medicines only as told by your doctor. These may include nasal sprays.  If you were prescribed an antibiotic medicine, take it as told by your doctor. Do not stop taking the antibiotic even if you start to feel better. Hydrate and Humidify  Drink enough water to keep your pee (urine) clear or pale yellow.  Use a cool mist humidifier to keep the humidity level in your home above 50%.  Breathe in steam for 10-15 minutes, 3-4 times a day or as told by your doctor. You can do this in the bathroom while a hot shower is running.  Try not to spend time in cool or dry air. Rest  Rest as much as possible.  Sleep with your head raised (elevated).  Make sure to get enough sleep each night. General instructions  Put a warm, moist washcloth on your face 3-4 times a day or as told by your doctor. This will help with discomfort.  Wash your hands often with soap and water. If there is no soap and water, use hand sanitizer.  Do not smoke. Avoid being around people who are smoking (secondhand smoke).  Keep all follow-up visits as told by your doctor. This is important. Contact a doctor if:  You have a fever.  Your symptoms get worse.  Your symptoms do not get better within 10 days. Get help right away if:  You have a very bad headache.  You cannot stop throwing up (vomiting).  You have pain or swelling around your face or eyes.  You have trouble seeing.  You feel confused.  Your neck is stiff.  You have trouble breathing. This information is not intended to replace advice given to you by your health care provider. Make sure you discuss any questions you have with your health care provider. Document Released: 10/25/2007 Document Revised: 01/02/2016 Document Reviewed: 03/03/2015 Elsevier Interactive Patient Education  2018 [employer] Upper Respiratory Infection, Adult Most upper respiratory infections (URIs) are caused by a virus.  A URI affects the nose, throat, and upper air passages. The most common type of URI is often called "the common cold." Follow these instructions at home:  Take medicines only as told by your doctor.  Gargle warm saltwater or take cough drops to comfort your throat as told by your doctor.  Use a warm mist humidifier or inhale steam from a shower to increase air moisture. This may make it easier to breathe.  Drink enough fluid to keep your pee (urine) clear or pale yellow.  Eat soups and other clear broths.  Have a healthy diet.  Rest as needed.  Go back to work when your fever is gone or your doctor says it is okay. ? You may need to stay home longer to avoid giving your URI to others. ? You can also wear a face mask and wash your hands often to prevent spread of the virus.  Use your inhaler more if you have asthma.  Do not use any tobacco products, including cigarettes, chewing tobacco, or electronic cigarettes. If you need help quitting, ask  your doctor. Contact a doctor if:  You are getting worse, not better.  Your symptoms are not helped by medicine.  You have chills.  You are getting more short of breath.  You have brown or red mucus.  You have yellow or brown discharge from your nose.  You have pain in your face, especially when you bend forward.  You have a fever.  You have puffy (swollen) neck glands.  You have pain while swallowing.  You have white areas in the back of your throat. Get help right away if:  You have very bad or constant: ? Headache. ? Ear pain. ? Pain in your forehead, behind your eyes, and over your cheekbones (sinus pain). ? Chest pain.  You have long-lasting (chronic) lung disease and any of the following: ? Wheezing. ? Long-lasting cough. ? Coughing up blood. ? A change in your usual mucus.  You have a stiff neck.  You have changes in your: ? Vision. ? Hearing. ? Thinking. ? Mood. This information is not intended to  replace advice given to you by your health care provider. Make sure you discuss any questions you have with your health care provider. Document Released: 10/25/2007 Document Revised: 01/09/2016 Document Reviewed: 08/13/2013 Elsevier Interactive Patient Education  2018 [employer].

## 2017-09-05 ENCOUNTER — Ambulatory Visit: Payer: Self-pay | Admitting: Family Medicine

## 2017-09-05 VITALS — BP 140/79 | HR 93 | Temp 97.7°F | Resp 20 | Ht 71.0 in | Wt 245.0 lb

## 2017-09-05 DIAGNOSIS — E1169 Type 2 diabetes mellitus with other specified complication: Secondary | ICD-10-CM

## 2017-09-05 DIAGNOSIS — W57XXXA Bitten or stung by nonvenomous insect and other nonvenomous arthropods, initial encounter: Secondary | ICD-10-CM

## 2017-09-05 DIAGNOSIS — Z0189 Encounter for other specified special examinations: Principal | ICD-10-CM

## 2017-09-05 DIAGNOSIS — L539 Erythematous condition, unspecified: Secondary | ICD-10-CM

## 2017-09-05 DIAGNOSIS — Z794 Long term (current) use of insulin: Secondary | ICD-10-CM

## 2017-09-05 DIAGNOSIS — Z8619 Personal history of other infectious and parasitic diseases: Secondary | ICD-10-CM

## 2017-09-05 DIAGNOSIS — E785 Hyperlipidemia, unspecified: Secondary | ICD-10-CM

## 2017-09-05 DIAGNOSIS — Z008 Encounter for other general examination: Secondary | ICD-10-CM

## 2017-09-05 DIAGNOSIS — E1165 Type 2 diabetes mellitus with hyperglycemia: Secondary | ICD-10-CM

## 2017-09-05 LAB — GLUCOSE, POCT (MANUAL RESULT ENTRY): POC GLUCOSE: 130 mg/dL — AB (ref 70–99)

## 2017-09-05 MED ORDER — DOXYCYCLINE HYCLATE 100 MG PO TABS: 100.0000 mg | ORAL_TABLET | Freq: Two times a day (BID) | ORAL | 0 refills | Status: AC

## 2017-09-05 NOTE — Progress Notes (Addendum)
Subjective: Annual biometrics screening and tick bite Patient presents for his annual biometric screening. Patient reports a history of hypertension, hyperlipidemia, type 2 diabetes, recurrent pancreatitis, and [facility].  Patient reports that he has been diagnosed with [facility] fever 3 times in the past.  Patient works for [employer] and frequently gets tick bites.  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  Patient reports pulling a dark-colored tick off of his skin overlying his left rib cage 2 weeks ago.  Duration of contact with tick unknown.  Patient reports the only symptom related to this is itching and that he has been scratching it a lot.  Patient noticed yesterday redness was forming around the bite.  Patient reports that when he was previously diagnosed with [facility] that he had systemic symptoms, which he does not currently have.  Denies any of these symptoms or any symptoms at all within the last month. Patient denies fever, chills, nausea, vomiting, headache, fatigue, malaise, body aches, myalgias, arthralgias, rash, abdominal pain, cardiac or neurologic symptoms.  Denies any other skin lesions at all.  Patient denies any sequela related to previous infection with [facility] spotted fever. Patient denies any other issues or concerns.   Review of Systems Otherwise unremarkable  Objective  Physical Exam General: Awake, alert and oriented. No acute distress. Well developed, hydrated and nourished. Appears stated age.  Afebrile. HEENT: Supple neck without adenopathy. Sclera is non-icteric. The ear canal is clear without discharge. The tympanic membrane is normal in appearance with normal landmarks and cone of light. Nasal mucosa is pink and moist. Oral mucosa is pink and moist. The pharynx is normal in appearance without tonsillar swelling or exudates.  Skin: Small lesion with granulation tissue noted to left rib cage 2 mm  in diameter with 1 cm of surrounding erythema and warmth to the touch.  No purulent drainage, induration, or abscess noted.  Full skin assessment reveals no other rashes or lesions.  Skin in warm, dry and intact. Appropriate color for ethnicity. Cardiac: Heart rate and rhythm are normal. No murmurs, gallops, or rubs are auscultated.  Respiratory: The chest wall is symmetric and without deformity. No signs of respiratory distress. Lung sounds are clear in all lobes bilaterally without rales, ronchi, or wheezes.  Neurological: The patient is awake, alert and oriented to person, place, and time with normal speech.  Memory is normal and thought processes intact. No gait abnormalities are appreciated.  Psychiatric: Appropriate mood and affect.  Lymph: No lymphadenopathy noted.   Assessment Annual biometrics screening Tick bite History of [place] spotted fever  Plan  Lipid panel pending. Encouraged routine visits with primary care provider.  Provided patient with resources to establish care with a primary care provider and encouraged him to do so today for an appointment as soon as possible. Fasting blood sugar 130 today. Patient's endocrinologist ordered additional labs for patient to have drawn today and the results sent to them.  Referral placed to infectious disease due to patient's history of 3 previous diagnoses of [facility] spotted fever and recurrent tick bite. Erythema and pruritus surrounding tick bite likely related to secondary infection as a result of scratching.  Treating with doxycycline because of patient's history of diabetes, and this would also cover [facility] spotted fever and Lyme disease.  Low probability that these diseases are present.  Ordered laboratory testing for these and a CBC - CMP already ordered by patient's endocrinologist. Follow-up with infectious  disease. Red flag symptoms and indications to seek medical care discussed.

## 2017-09-05 NOTE — Progress Notes (Signed)

## 2017-09-06 LAB — MICROALBUMIN / CREATININE URINE RATIO
CREATININE, UR: 176.6 mg/dL
MICROALB/CREAT RATIO: 57 mg/g{creat} — AB (ref 0.0–30.0)
Microalbumin, Urine: 100.6 ug/mL

## 2017-09-07 LAB — COMPREHENSIVE METABOLIC PANEL
ALBUMIN: 4.9 g/dL (ref 3.5–5.5)
ALT: 36 IU/L (ref 0–44)
AST: 25 IU/L (ref 0–40)
Albumin/Globulin Ratio: 2 (ref 1.2–2.2)
Alkaline Phosphatase: 62 IU/L (ref 39–117)
BUN / CREAT RATIO: 19 (ref 9–20)
BUN: 16 mg/dL (ref 6–20)
Bilirubin Total: 0.3 mg/dL (ref 0.0–1.2)
CHLORIDE: 102 mmol/L (ref 96–106)
CO2: 22 mmol/L (ref 20–29)
Calcium: 9.7 mg/dL (ref 8.7–10.2)
Creatinine, Ser: 0.83 mg/dL (ref 0.76–1.27)
GFR, EST AFRICAN AMERICAN: 133 mL/min/{1.73_m2} (ref >59)
GFR, EST NON AFRICAN AMERICAN: 115 mL/min/{1.73_m2} (ref >59)
GLUCOSE: 134 mg/dL — AB (ref 65–99)
Globulin, Total: 2.4 g/dL (ref 1.5–4.5)
Potassium: 4.5 mmol/L (ref 3.5–5.2)
Sodium: 141 mmol/L (ref 134–144)
TOTAL PROTEIN: 7.3 g/dL (ref 6.0–8.5)

## 2017-09-07 LAB — CBC WITH DIFFERENTIAL/PLATELET
BASOS: 0 %
Basophils Absolute: 0 10*3/uL (ref 0.0–0.2)
EOS (ABSOLUTE): 0.2 10*3/uL (ref 0.0–0.4)
Eos: 3 %
Hematocrit: 41.8 % (ref 37.5–51.0)
Hemoglobin: 13.9 g/dL (ref 13.0–17.7)
IMMATURE GRANULOCYTES: 0 %
Immature Grans (Abs): 0 10*3/uL (ref 0.0–0.1)
LYMPHS ABS: 2.9 10*3/uL (ref 0.7–3.1)
Lymphs: 36 %
MCH: 28.4 pg (ref 26.6–33.0)
MCHC: 33.3 g/dL (ref 31.5–35.7)
MCV: 85 fL (ref 79–97)
Monocytes Absolute: 0.6 10*3/uL (ref 0.1–0.9)
Monocytes: 8 %
NEUTROS PCT: 53 %
Neutrophils Absolute: 4.2 10*3/uL (ref 1.4–7.0)
PLATELETS: 384 10*3/uL — AB (ref 150–379)
RBC: 4.9 x10E6/uL (ref 4.14–5.80)
RDW: 13.8 % (ref 12.3–15.4)
WBC: 7.9 10*3/uL (ref 3.4–10.8)

## 2017-09-07 LAB — LIPID PANEL WITH LDL/HDL RATIO
Cholesterol, Total: 164 mg/dL (ref 100–199)
HDL: 34 mg/dL — AB (ref >39)
LDL CALC: 83 mg/dL (ref 0–99)
LDL/HDL RATIO: 2.4 ratio (ref 0.0–3.6)
Triglycerides: 233 mg/dL — ABNORMAL HIGH (ref 0–149)
VLDL CHOLESTEROL CAL: 47 mg/dL — AB (ref 5–40)

## 2017-09-07 LAB — TSH: TSH: 2.52 u[IU]/mL (ref 0.450–4.500)

## 2017-09-07 LAB — LYME AB/WESTERN BLOT REFLEX
LYME DISEASE AB, QUANT, IGM: <0.8 index (ref 0.00–0.79)
Lyme IgG/IgM Ab: <0.91 {ISR} (ref 0.00–0.90)

## 2017-09-07 LAB — HGB A1C W/O EAG: Hgb A1c MFr Bld: 7.8 % — ABNORMAL HIGH (ref 4.8–5.6)

## 2017-09-07 LAB — RMSF, IGG, IFA: RMSF, IGG, IFA: 1:64 {titer} — ABNORMAL HIGH

## 2017-09-07 LAB — ROCKY MTN SPOTTED FVR AB, IGG-BLOOD: RMSF IGG: POSITIVE — AB

## 2017-09-07 NOTE — Progress Notes (Signed)
Spoke with patient and informed him of his lab results and provided education regarding results. Patient reports improved erythema surrounding bite and has been taking doxycycline as prescribed. Patient is otherwise asymptomatic with no new symptoms or concerns. Advised patient to complete course of antibiotics and follow up with ID this week. Red flag symptoms and indications to seek medical care discussed.

## 2017-09-07 NOTE — Progress Notes (Signed)
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 Attempted to reach the patient by phone to discuss results but was unable to reach him. Left a voicemail for the patient to call back. Patient already being treated appropriately with doxycycline.

## 2017-09-10 ENCOUNTER — Telehealth: Payer: Self-pay | Admitting: *Deleted

## 2017-09-10 NOTE — Telephone Encounter (Signed)
Patient has had [facility] 4 times since age 37, has a new tick bite.  His doctor referred him to [facility], but also gave him a prescription for Doxycycline 100 mg twice a day for 10 days in case his test results came back positive for RMSF (per patient, they did). He would like to know if [facility] would offer any different treatment for RMSF before he comes here for an appointment. Andree CossHowell, Julene Rahn M, RN

## 2017-09-10 NOTE — Telephone Encounter (Signed)
YOu cannot get RMSF 4 times, Doxy is the only good treatment.  Ehrlichia can behave like RMSF but doxy is best for either. Fine to see him but no other abx is better or approved that is available . Old very toxic drug was other option and its not available

## 2017-09-10 NOTE — Telephone Encounter (Signed)
-----   Message from InstituteJohaura Celedonio sent at 09/06/2017  4:43 PM EDT ----- We got a referral for this patient, but he has a couple of questions before he schedules the appointment

## 2017-09-10 NOTE — Telephone Encounter (Signed)
Relayed information to patient, he was grateful for the advice.  Patient declined appointment at this time, stating it seems he has already started treatment.

## 2017-09-11 NOTE — Telephone Encounter (Signed)
Ok very good.

## 2018-04-21 ENCOUNTER — Other Ambulatory Visit: Payer: Self-pay | Admitting: Medical

## 2019-04-15 ENCOUNTER — Encounter: Payer: Self-pay | Admitting: Emergency Medicine

## 2019-04-15 ENCOUNTER — Emergency Department
Admission: EM | Admit: 2019-04-15 | Discharge: 2019-04-16 | Disposition: A | Discharge status: Home / Self Care | Payer: Managed Care, Other (non HMO) | Attending: Emergency Medicine | Admitting: Emergency Medicine

## 2019-04-15 ENCOUNTER — Emergency Department: Payer: Managed Care, Other (non HMO)

## 2019-04-15 ENCOUNTER — Other Ambulatory Visit: Payer: Self-pay

## 2019-04-15 DIAGNOSIS — K859 Acute pancreatitis without necrosis or infection, unspecified: Secondary | ICD-10-CM | POA: Insufficient documentation

## 2019-04-15 DIAGNOSIS — I10 Essential (primary) hypertension: Secondary | ICD-10-CM | POA: Diagnosis not present

## 2019-04-15 DIAGNOSIS — R109 Unspecified abdominal pain: Secondary | ICD-10-CM | POA: Diagnosis present

## 2019-04-15 DIAGNOSIS — Z794 Long term (current) use of insulin: Secondary | ICD-10-CM | POA: Diagnosis not present

## 2019-04-15 DIAGNOSIS — Z79899 Other long term (current) drug therapy: Secondary | ICD-10-CM | POA: Insufficient documentation

## 2019-04-15 DIAGNOSIS — Z87891 Personal history of nicotine dependence: Secondary | ICD-10-CM | POA: Insufficient documentation

## 2019-04-15 DIAGNOSIS — E119 Type 2 diabetes mellitus without complications: Secondary | ICD-10-CM | POA: Insufficient documentation

## 2019-04-15 LAB — COMPREHENSIVE METABOLIC PANEL
ALT: 42 U/L (ref 0–44)
AST: 37 U/L (ref 15–41)
Albumin: 4.4 g/dL (ref 3.5–5.0)
Alkaline Phosphatase: 64 U/L (ref 38–126)
Anion gap: 19 — ABNORMAL HIGH (ref 5–15)
BUN: 13 mg/dL (ref 6–20)
CO2: 23 mmol/L (ref 22–32)
Calcium: 10 mg/dL (ref 8.9–10.3)
Chloride: 93 mmol/L — ABNORMAL LOW (ref 98–111)
Creatinine, Ser: 0.83 mg/dL (ref 0.61–1.24)
GFR calc Af Amer: >60 mL/min (ref >60)
GFR calc non Af Amer: >60 mL/min (ref >60)
Glucose, Bld: 151 mg/dL — ABNORMAL HIGH (ref 70–99)
Potassium: 4.4 mmol/L (ref 3.5–5.1)
Sodium: 135 mmol/L (ref 135–145)
Total Bilirubin: 1.7 mg/dL — ABNORMAL HIGH (ref 0.3–1.2)
Total Protein: 7.6 g/dL (ref 6.5–8.1)

## 2019-04-15 LAB — CBC
HCT: 45.5 % (ref 39.0–52.0)
Hemoglobin: 15 g/dL (ref 13.0–17.0)
MCH: 27.5 pg (ref 26.0–34.0)
MCHC: 33 g/dL (ref 30.0–36.0)
MCV: 83.5 fL (ref 80.0–100.0)
Platelets: 391 10*3/uL (ref 150–400)
RBC: 5.45 MIL/uL (ref 4.22–5.81)
RDW: 13.7 % (ref 11.5–15.5)
WBC: 16.2 10*3/uL — ABNORMAL HIGH (ref 4.0–10.5)
nRBC: 0 % (ref 0.0–0.2)

## 2019-04-15 LAB — LIPASE, BLOOD: Lipase: 139 U/L — ABNORMAL HIGH (ref 11–51)

## 2019-04-15 MED ORDER — IOHEXOL 300 MG/ML  SOLN
100.0000 mL | Freq: Once | INTRAMUSCULAR | Status: AC | PRN
  Administered 2019-04-15: 23:00:00 100 mL via INTRAVENOUS

## 2019-04-15 MED ORDER — HYDROMORPHONE HCL 1 MG/ML IJ SOLN
1.0000 mg | Freq: Once | INTRAMUSCULAR | Status: AC
  Administered 2019-04-15: 1 mg via INTRAVENOUS
  Filled 2019-04-15: qty 1

## 2019-04-15 MED ORDER — ONDANSETRON HCL 4 MG/2ML IJ SOLN
4.0000 mg | Freq: Once | INTRAMUSCULAR | Status: AC
  Administered 2019-04-15: 23:00:00 4 mg via INTRAVENOUS
  Filled 2019-04-15: qty 2

## 2019-04-15 MED ORDER — SODIUM CHLORIDE 0.9 % IV BOLUS
1000.0000 mL | Freq: Once | INTRAVENOUS | Status: AC
  Administered 2019-04-15: 1000 mL via INTRAVENOUS

## 2019-04-15 MED ORDER — SODIUM CHLORIDE 0.9 % IV BOLUS: 1000.0000 mL | Freq: Once | INTRAVENOUS | Status: DC

## 2019-04-15 MED ORDER — SODIUM CHLORIDE 0.9% FLUSH
3.0000 mL | Freq: Once | INTRAVENOUS | Status: AC
  Administered 2019-04-15: 3 mL via INTRAVENOUS

## 2019-04-15 NOTE — ED Notes (Signed)
1st attempt for 20g IV at [facility] without success.

## 2019-04-15 NOTE — Discharge Instructions (Addendum)
You have pancreatitis.  You should have your triglyceride level followed up with your primary care doctor.  You may need to discuss your statins further to help get this lower so that you do not have recurrent pancreatitis.

## 2019-04-15 NOTE — ED Provider Notes (Signed)
[facility] Emergency Department Provider Note  ____________________________________________   First MD Initiated Contact with Patient 04/15/19 2217     (approximate)  I have reviewed the triage vital signs and the nursing notes.   HISTORY  Chief Complaint Abdominal Pain    HPI Dale Mendoza is a 38 y.o. male with diabetes, hypertension, hyperlipidemia, previous pancreatitis secondary triglyceride elevation who presents with abdominal pain.  Patient states he had sudden onset of abdominal pain on his left side that radiates into his back that is severe, constant, nothing makes better, nothing makes it worse.  He says that he is not had any fevers.  He is not had a prior pancreatitis flare for a while now.  He says it feels similar but may be slightly worse than previous.  He has been taking all of his medications for his high triglycerides.  He denies ever having an issue with his gallbladder.  Had previous MRCP that was negative.  He denies alcohol use.          Past Medical History:  Diagnosis Date  . Allergy    seasonal  . Diabetes mellitus without complication (HCC)   . GERD (gastroesophageal reflux disease)   . Hyperlipidemia   . Hypertension   . Pancreatitis     Patient Active Problem List   Diagnosis Date Noted  . Recurrent pancreatitis 12/17/2016    Past Surgical History:  Procedure Laterality Date  . HERNIA REPAIR    . TONSILLECTOMY      Prior to Admission medications   Medication Sig Start Date End Date Taking? Authorizing Provider  albuterol (PROVENTIL HFA;VENTOLIN HFA) 108 (90 Base) MCG/ACT inhaler Inhale 1-2 puffs into the lungs every 6 (six) hours as needed for wheezing or shortness of breath. 10/04/15   Sherrie MustacheFisher, Roselyn BeringSusan W, PA-C  atorvastatin (LIPITOR) 40 MG tablet TAKE 1 TABLET BY MOUTH DAILY 02/09/17   Sherrie MustacheFisher, Roselyn BeringSusan W, PA-C  Cetirizine HCl (ZYRTEC ALLERGY) 10 MG CAPS Take 1 tablet by mouth daily.    [provider]   fenofibrate (TRICOR) 145 MG tablet Take 1 tablet (145 mg total) by mouth daily. Will need fasting labs in july 12/06/16   Fisher, Roselyn BeringSusan W, PA-C  fluticasone [facility](FLONASE) 50 MCG/ACT nasal spray Place 1 spray into both nostrils 2 (two) times daily. 10/04/15   Fisher, Roselyn BeringSusan W, PA-C  glimepiride (AMARYL) 4 MG tablet Take 4 mg by mouth daily. 06/12/17 06/12/18  [provider]  insulin glargine (LANTUS) 100 unit/mL SOPN Inject 0.15 mLs (15 Units total) into the skin at bedtime. 12/18/16   Houston SirenSainani, Vivek J, MD  Insulin Pen Needle (NOVOFINE) 30G X 8 MM MISC Inject 10 each into the skin as needed. 12/18/16   [place] SirenSainani, Vivek J, MD  lisinopril (PRINIVIL,ZESTRIL) 10 MG tablet TAKE 1 TABLET BY MOUTH TWICE DAILY 01/10/17   Sherrie MustacheFisher, Roselyn BeringSusan W, PA-C  loratadine (CLARITIN) 10 MG tablet Take 1 tablet (10 mg total) by mouth daily. 07/21/15   Betancourt, Jarold Songina A, NP  metformin (FORTAMET) 1000 MG (OSM) 24 hr tablet Take 1 tablet (1,000 mg total) by mouth 2 (two) times daily with a meal. 12/18/16   Sainani, Rolly PancakeVivek J, MD  Omega-3 Fatty Acids (FISH OIL) 1200 MG CAPS Take by mouth 2 (two) times daily.    [provider]  ONE TOUCH ULTRA TEST test strip USE AS DIRECTED THREE TIMES A DAY 12/29/16   Sherrie MustacheFisher, Roselyn BeringSusan W, PA-C  oxyCODONE-acetaminophen (ROXICET) 5-325 MG tablet Take 1 tablet by mouth  every 4 (four) hours as needed for severe pain. 05/08/17   Paulette Blanch, MD  ranitidine (ZANTAC) 150 MG tablet Take 1 tablet (150 mg total) by mouth at bedtime. 07/20/17   Talmage Nap, PA-C    Allergies Patient has no known allergies.  Family History  Problem Relation Age of Onset  . Colon cancer Father   . Pancreatic cancer Paternal Grandfather     Social History Social History   Tobacco Use  . Smoking status: Former Smoker    Types: Cigarettes  . Smokeless tobacco: Never Used  Substance Use Topics  . Alcohol use: Yes    Alcohol/week: 0.0 standard drinks  . Drug use: No      Review of Systems Constitutional:  No fever/chills Eyes: No visual changes. ENT: No sore throat. Cardiovascular: Denies chest pain. Respiratory: Denies shortness of breath. Gastrointestinal positive abdominal pain and nausea no diarrhea.  No constipation. Genitourinary: Negative for dysuria. Musculoskeletal: Negative for back pain. Skin: Negative for rash. Neurological: Negative for headaches, focal weakness or numbness. All other ROS negative ____________________________________________   PHYSICAL EXAM:  VITAL SIGNS: ED Triage Vitals  Enc Vitals Group     BP 04/15/19 1839 (!) 143/79     Pulse Rate 04/15/19 1839 (!) 111     Resp 04/15/19 1839 18     Temp 04/15/19 1839 98.2 F (36.8 C)     Temp Source 04/15/19 1839 Oral     SpO2 04/15/19 1839 98 %     Weight 04/15/19 1836 244 lb 14.9 oz (111.1 kg)     Height --      Head Circumference --      Peak Flow --      Pain Score 04/15/19 1835 10     Pain Loc --      Pain Edu? --      Excl. in [place]? --     Constitutional: Alert and oriented. Well appearing and in no acute distress. Eyes: Conjunctivae are normal. EOMI. Head: Atraumatic. Nose: No congestion/rhinnorhea. Mouth/Throat: Mucous membranes are moist.   Neck: No stridor. Trachea Midline. FROM Cardiovascular: Tachycardic, regular rhythm. Grossly normal heart sounds.  Good peripheral circulation. Respiratory: Normal respiratory effort.  No retractions. Lungs CTAB. Gastrointestinal: Tenderness in the left upper quadrant.  No distention. No abdominal bruits.  Musculoskeletal: No lower extremity tenderness nor edema.  No joint effusions. Neurologic:  Normal speech and language. No gross focal neurologic deficits are appreciated.  Skin:  Skin is warm, dry and intact. No rash noted. Psychiatric: Mood and affect are normal. Speech and behavior are normal. GU: Deferred   ____________________________________________   LABS (all labs ordered are listed, but only abnormal results are displayed)  Labs Reviewed   LIPASE, BLOOD - Abnormal; Notable for the following components:      Result Value   Lipase 139 (*)    All other components within normal limits  COMPREHENSIVE METABOLIC PANEL - Abnormal; Notable for the following components:   Chloride 93 (*)    Glucose, Bld 151 (*)    Total Bilirubin 1.7 (*)    Anion gap 19 (*)    All other components within normal limits  CBC - Abnormal; Notable for the following components:   WBC 16.2 (*)    All other components within normal limits  URINALYSIS, COMPLETE (UACMP) WITH MICROSCOPIC   ____________________________________________   ED ECG REPORT I, Vanessa Coyote, the attending physician, personally viewed and interpreted this ECG.  EKG is sinus tachycardia rate  of 113, no ST elevation, no T wave inversions, normal intervals. ____________________________________________  RADIOLOGY   Pending  Official radiology report(s): No results found.  ____________________________________________   PROCEDURES  Procedure(s) performed (including Critical Care):  Procedures   ____________________________________________   INITIAL IMPRESSION / ASSESSMENT AND PLAN / ED COURSE  MARDELL CRAGG was evaluated in Emergency Department on 04/15/2019 for the symptoms described in the history of present illness. He was evaluated in the context of the global COVID-19 pandemic, which necessitated consideration that the patient might be at risk for infection with the SARS-CoV-2 virus that causes COVID-19. Institutional protocols and algorithms that pertain to the evaluation of patients at risk for COVID-19 are in a state of rapid change based on information released by regulatory bodies including the CDC and federal and state organizations. These policies and algorithms were followed during the patient's care in the ED.    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  Will get labs evaluate for AKI, electrolyte abnormalities, dehydration.   Patient is significantly tender on exam and does have a very high white count of 16 as well as tachycardia.  Discussed with patient and will get CT scan to ensure there is no evidence of pseudocyst, necrosis, other complications.  He does have slight T bili elevation but he does not have any right upper quadrant tenderness has had no prior issues with his gallbladder before.  Lipase is elevated with she is consistent with his pancreatitis.  Patient does have some dehydration with an anion gap of 19.  Glucose is slightly elevated at 151 but I do not think this is secondary to DKA  Patient handed off to incoming team pending CT scan and reevaluation.  Consider discharge home if patient is feeling better versus admission.  Patient states he will need to follow-up with his triglyceride level if he is discharged home.  ____________________________________________   FINAL CLINICAL IMPRESSION(S) / ED DIAGNOSES   Final diagnoses:  Acute pancreatitis, unspecified complication status, unspecified pancreatitis type      MEDICATIONS GIVEN DURING THIS VISIT:  Medications  sodium chloride flush (NS) 0.9 % injection 3 mL (has no administration in time range)  HYDROmorphone (DILAUDID) injection 1 mg (has no administration in time range)  ondansetron (ZOFRAN) injection 4 mg (has no administration in time range)  sodium chloride 0.9 % bolus 1,000 mL (has no administration in time range)     ED Discharge Orders    None       Note:  This document was prepared using Dragon voice recognition software and may include unintentional dictation errors.   Concha Se, MD 04/15/19 2235

## 2019-04-15 NOTE — ED Triage Notes (Signed)
C/O epigastric abdominal pain x 1 day.  States has history of pancreatitis, and pain is similar to previous episodes.

## 2019-04-15 NOTE — ED Notes (Signed)
Pt leaving for CT. Will hang fluids once pt back.

## 2019-04-16 LAB — GLUCOSE, CAPILLARY: Glucose-Capillary: 120 mg/dL — ABNORMAL HIGH (ref 70–99)

## 2019-04-16 LAB — TRIGLYCERIDES: Triglycerides: 1936 mg/dL — ABNORMAL HIGH (ref <150)

## 2019-04-16 MED ORDER — OXYCODONE-ACETAMINOPHEN 5-325 MG PO TABS: 1.0000 | ORAL_TABLET | ORAL | 0 refills | Status: DC | PRN

## 2019-04-16 MED ORDER — OXYCODONE HCL 5 MG PO TABS
5.0000 mg | ORAL_TABLET | Freq: Once | ORAL | Status: AC
  Administered 2019-04-16: 01:00:00 5 mg via ORAL
  Filled 2019-04-16: qty 1

## 2019-04-16 MED ORDER — SODIUM CHLORIDE 0.9 % IV BOLUS
1000.0000 mL | Freq: Once | INTRAVENOUS | Status: AC
  Administered 2019-04-16: 01:00:00 1000 mL via INTRAVENOUS

## 2019-04-16 MED ORDER — OXYCODONE HCL 5 MG PO TABS
10.0000 mg | ORAL_TABLET | Freq: Once | ORAL | Status: AC
  Administered 2019-04-16: 10 mg via ORAL
  Filled 2019-04-16: qty 2

## 2019-04-16 MED ORDER — ACETAMINOPHEN 500 MG PO TABS
1000.0000 mg | ORAL_TABLET | Freq: Once | ORAL | Status: AC
  Administered 2019-04-16: 1000 mg via ORAL
  Filled 2019-04-16: qty 2

## 2019-04-16 MED ORDER — ONDANSETRON 4 MG PO TBDP: 4.0000 mg | ORAL_TABLET | Freq: Three times a day (TID) | ORAL | 0 refills | Status: DC | PRN

## 2019-04-16 NOTE — ED Provider Notes (Addendum)
-----------------------------------------   2:21 AM on 04/16/2019 -----------------------------------------   Blood pressure 125/85, pulse (!) 110, temperature 98.2 F (36.8 C), temperature source Oral, resp. rate 16, weight 111.1 kg, SpO2 99 %.  Assuming care from Dr. Jari Pigg of [facility] RUBENSTEIN is a 38 y.o. male with a chief complaint of Abdominal Pain .    Please refer to H&P by previous MD for further details. 71M here with pancreatitis from high triglycerides.  The current plan of care is to reassess after pain meds and IVF.  HR significantly improved. Patient tolerating PO. Pain is well controlled with PO meds and currently 3/10. Discussed with patient admission vs discharge since his HR is 103 at this time. Patient wants to try to go home. Per [place] criteria he is low risk. Will re-dose oxycodone prior to dc since pharmacy is closed until 8AM.  Discussed bland diet, pain and nausea control at home.  Discussed close follow-up with PCP and my standard return precautions.     _________________________ 2:48 AM on 04/16/2019 -----------------------------------------  Vitals prior to discharge O2 96%, HR 103, BP 120/71, temp 98.62F -> vitals done by me     Rudene Re, MD 04/16/19 [place], [place], MD 04/16/19 [phone]

## 2019-04-21 ENCOUNTER — Other Ambulatory Visit: Payer: Self-pay

## 2019-04-21 ENCOUNTER — Emergency Department
Admission: EM | Admit: 2019-04-21 | Discharge: 2019-04-21 | Disposition: A | Discharge status: Home / Self Care | Payer: Managed Care, Other (non HMO) | Attending: Emergency Medicine | Admitting: Emergency Medicine

## 2019-04-21 ENCOUNTER — Encounter: Payer: Self-pay | Admitting: Emergency Medicine

## 2019-04-21 DIAGNOSIS — R1013 Epigastric pain: Secondary | ICD-10-CM | POA: Diagnosis not present

## 2019-04-21 DIAGNOSIS — Z79899 Other long term (current) drug therapy: Secondary | ICD-10-CM | POA: Insufficient documentation

## 2019-04-21 DIAGNOSIS — I10 Essential (primary) hypertension: Secondary | ICD-10-CM | POA: Insufficient documentation

## 2019-04-21 DIAGNOSIS — Z87891 Personal history of nicotine dependence: Secondary | ICD-10-CM | POA: Diagnosis not present

## 2019-04-21 DIAGNOSIS — R109 Unspecified abdominal pain: Secondary | ICD-10-CM | POA: Diagnosis present

## 2019-04-21 DIAGNOSIS — Z794 Long term (current) use of insulin: Secondary | ICD-10-CM | POA: Diagnosis not present

## 2019-04-21 DIAGNOSIS — E119 Type 2 diabetes mellitus without complications: Secondary | ICD-10-CM | POA: Insufficient documentation

## 2019-04-21 LAB — URINALYSIS, COMPLETE (UACMP) WITH MICROSCOPIC
Bacteria, UA: NONE SEEN
Bilirubin Urine: NEGATIVE
Glucose, UA: >=500 mg/dL — AB
Hgb urine dipstick: NEGATIVE
Ketones, ur: NEGATIVE mg/dL
Leukocytes,Ua: NEGATIVE
Nitrite: NEGATIVE
Protein, ur: NEGATIVE mg/dL
Specific Gravity, Urine: 1.03 (ref 1.005–1.030)
Squamous Epithelial / HPF: NONE SEEN (ref 0–5)
WBC, UA: NONE SEEN WBC/hpf (ref 0–5)
pH: 5 (ref 5.0–8.0)

## 2019-04-21 LAB — COMPREHENSIVE METABOLIC PANEL
ALT: 30 U/L (ref 0–44)
AST: 25 U/L (ref 15–41)
Albumin: 4.1 g/dL (ref 3.5–5.0)
Alkaline Phosphatase: 74 U/L (ref 38–126)
Anion gap: 15 (ref 5–15)
BUN: 12 mg/dL (ref 6–20)
CO2: 25 mmol/L (ref 22–32)
Calcium: 9.9 mg/dL (ref 8.9–10.3)
Chloride: 101 mmol/L (ref 98–111)
Creatinine, Ser: 0.7 mg/dL (ref 0.61–1.24)
GFR calc Af Amer: >60 mL/min (ref >60)
GFR calc non Af Amer: >60 mL/min (ref >60)
Glucose, Bld: 205 mg/dL — ABNORMAL HIGH (ref 70–99)
Potassium: 4 mmol/L (ref 3.5–5.1)
Sodium: 141 mmol/L (ref 135–145)
Total Bilirubin: 0.7 mg/dL (ref 0.3–1.2)
Total Protein: 8.2 g/dL — ABNORMAL HIGH (ref 6.5–8.1)

## 2019-04-21 LAB — CBC
HCT: 44.6 % (ref 39.0–52.0)
Hemoglobin: 14.2 g/dL (ref 13.0–17.0)
MCH: 26.9 pg (ref 26.0–34.0)
MCHC: 31.8 g/dL (ref 30.0–36.0)
MCV: 84.6 fL (ref 80.0–100.0)
Platelets: 452 10*3/uL — ABNORMAL HIGH (ref 150–400)
RBC: 5.27 MIL/uL (ref 4.22–5.81)
RDW: 13.3 % (ref 11.5–15.5)
WBC: 10.5 10*3/uL (ref 4.0–10.5)
nRBC: 0 % (ref 0.0–0.2)

## 2019-04-21 LAB — LIPASE, BLOOD: Lipase: 26 U/L (ref 11–51)

## 2019-04-21 MED ORDER — SODIUM CHLORIDE 0.9% FLUSH: 3.0000 mL | Freq: Once | INTRAVENOUS | Status: DC

## 2019-04-21 NOTE — ED Provider Notes (Signed)
[facility] Emergency Department Provider Note   ____________________________________________    I have reviewed the triage vital signs and the nursing notes.   HISTORY  Chief Complaint Abdominal Pain     HPI Dale Mendoza is a 38 y.o. male who presents with complaints of abdominal pain.  Patient reports a history of pancreatitis in fact recently had a bout of pancreatitis earlier last week, was seen here had elevated lipase, has been doing better from that regard.  Reports that he still has soreness in his epigastrium but he is concerned because he had some diarrhea yesterday.  Denies fevers did have a brief episode of chills.  Still has pain medication at home.  Does not smoke does not drink.  Past Medical History:  Diagnosis Date  . Allergy    seasonal  . Diabetes mellitus without complication (HCC)   . GERD (gastroesophageal reflux disease)   . Hyperlipidemia   . Hypertension   . Pancreatitis     Patient Active Problem List   Diagnosis Date Noted  . Recurrent pancreatitis 12/17/2016    Past Surgical History:  Procedure Laterality Date  . HERNIA REPAIR    . TONSILLECTOMY      Prior to Admission medications   Medication Sig Start Date End Date Taking? Authorizing Provider  albuterol (PROVENTIL HFA;VENTOLIN HFA) 108 (90 Base) MCG/ACT inhaler Inhale 1-2 puffs into the lungs every 6 (six) hours as needed for wheezing or shortness of breath. 10/04/15   Sherrie MustacheFisher, Roselyn BeringSusan W, PA-C  atorvastatin (LIPITOR) 40 MG tablet TAKE 1 TABLET BY MOUTH DAILY 02/09/17   Sherrie MustacheFisher, Roselyn BeringSusan W, PA-C  Cetirizine HCl (ZYRTEC ALLERGY) 10 MG CAPS Take 1 tablet by mouth daily.    [provider]  fenofibrate (TRICOR) 145 MG tablet Take 1 tablet (145 mg total) by mouth daily. Will need fasting labs in july 12/06/16   Fisher, Roselyn BeringSusan W, PA-C  fluticasone [facility](FLONASE) 50 MCG/ACT nasal spray Place 1 spray into both nostrils 2 (two) times daily. 10/04/15   Fisher, Roselyn BeringSusan W, PA-C   glimepiride (AMARYL) 4 MG tablet Take 4 mg by mouth daily. 06/12/17 06/12/18  [provider]  insulin glargine (LANTUS) 100 unit/mL SOPN Inject 0.15 mLs (15 Units total) into the skin at bedtime. 12/18/16   [place] SirenSainani, Vivek J, MD  Insulin Pen Needle (NOVOFINE) 30G X 8 MM MISC Inject 10 each into the skin as needed. 12/18/16   [place] SirenSainani, Vivek J, MD  lisinopril (PRINIVIL,ZESTRIL) 10 MG tablet TAKE 1 TABLET BY MOUTH TWICE DAILY 01/10/17   Sherrie MustacheFisher, Roselyn BeringSusan W, PA-C  loratadine (CLARITIN) 10 MG tablet Take 1 tablet (10 mg total) by mouth daily. 07/21/15   Betancourt, Jarold Songina A, NP  metformin (FORTAMET) 1000 MG (OSM) 24 hr tablet Take 1 tablet (1,000 mg total) by mouth 2 (two) times daily with a meal. 12/18/16   Sainani, Rolly PancakeVivek J, MD  Omega-3 Fatty Acids (FISH OIL) 1200 MG CAPS Take by mouth 2 (two) times daily.    [provider]  ondansetron (ZOFRAN ODT) 4 MG disintegrating tablet Take 1 tablet (4 mg total) by mouth every 8 (eight) hours as needed. 04/16/19   Nita SickleVeronese, [place], MD  ONE [facility] TEST test strip USE AS DIRECTED THREE TIMES A DAY 12/29/16   Sherrie MustacheFisher, Roselyn BeringSusan W, PA-C  oxyCODONE-acetaminophen (PERCOCET) 5-325 MG tablet Take 1 tablet by mouth every 4 (four) hours as needed. 04/16/19   Nita SickleVeronese, [place], MD     Allergies Patient has no known allergies.  Family History  Problem Relation Age of Onset  . Colon cancer Father   . Pancreatic cancer Paternal Grandfather     Social History Social History   Tobacco Use  . Smoking status: Former Smoker    Types: Cigarettes  . Smokeless tobacco: Never Used  Substance Use Topics  . Alcohol use: Yes    Alcohol/week: 0.0 standard drinks  . Drug use: No    Review of Systems  Constitutional: As above Eyes: No visual changes.  ENT: No sore throat. Cardiovascular: Denies chest pain. Respiratory: Denies shortness of breath. Gastrointestinal: As above Genitourinary: Negative for dysuria. Musculoskeletal: Negative for back pain. Skin:  Negative for rash. Neurological: Negative for headaches   ____________________________________________   PHYSICAL EXAM:  VITAL SIGNS: ED Triage Vitals  Enc Vitals Group     BP 04/21/19 0919 (!) 146/81     Pulse Rate 04/21/19 0919 90     Resp 04/21/19 0919 16     Temp 04/21/19 0919 98.9 F (37.2 C)     Temp Source 04/21/19 0919 Oral     SpO2 04/21/19 0919 98 %     Weight 04/21/19 0919 117.9 kg (260 lb)     Height 04/21/19 0919 1.803 m (5\' 11" )     Head Circumference --      Peak Flow --      Pain Score 04/21/19 0933 7     Pain Loc --      Pain Edu? --      Excl. in GC? --     Constitutional: Alert and oriented.   Nose: No congestion/rhinnorhea.  Cardiovascular: Normal rate, regular rhythm. Grossly normal heart sounds.  Good peripheral circulation. Respiratory: Normal respiratory effort.  No retractions. Lungs CTAB. Gastrointestinal: Soft and nontender. No distention.  No CVA tenderness.  Reassuring exam  Musculoskeletal:   Warm and well perfused Neurologic:  Normal speech and language. No gross focal neurologic deficits are appreciated.  Skin:  Skin is warm, dry and intact. No rash noted. Psychiatric: Mood and affect are normal. Speech and behavior are normal.  ____________________________________________   LABS (all labs ordered are listed, but only abnormal results are displayed)  Labs Reviewed  COMPREHENSIVE METABOLIC PANEL - Abnormal; Notable for the following components:      Result Value   Glucose, Bld 205 (*)    Total Protein 8.2 (*)    All other components within normal limits  CBC - Abnormal; Notable for the following components:   Platelets 452 (*)    All other components within normal limits  URINALYSIS, COMPLETE (UACMP) WITH MICROSCOPIC - Abnormal; Notable for the following components:   Color, Urine YELLOW (*)    APPearance CLEAR (*)    Glucose, UA >=500 (*)    All other components within normal limits  LIPASE, BLOOD    ____________________________________________  EKG  None ____________________________________________  RADIOLOGY  None ____________________________________________   PROCEDURES  Procedure(s) performed: No  Procedures   Critical Care performed: No ____________________________________________   INITIAL IMPRESSION / ASSESSMENT AND PLAN / ED COURSE  Pertinent labs & imaging results that were available during my care of the patient were reviewed by me and considered in my medical decision making (see chart for details).  Patient presents with brief episode of chills, diarrhea yesterday, improved today, some achiness.  Improving epigastric pain.  Lab work today is improved from prior.  Question whether he is still recovering from pancreatitis or possibly developing viral illness, recommend contact precautions, outpatient follow-up if no improvement.  Return precautions discussed    ____________________________________________   FINAL CLINICAL IMPRESSION(S) / ED DIAGNOSES  Final diagnoses:  Epigastric pain        Note:  This document was prepared using Dragon voice recognition software and may include unintentional dictation errors.   Lavonia Drafts, MD 04/21/19 1224

## 2019-04-21 NOTE — ED Triage Notes (Addendum)
Pt here with c/o right mid to lower abd pain that began Friday, was seen here for the same last Tuesday and diagnosed with pancreatitis, hx of the same, given pain med but no antibiotics. Having diarrhea started yesterday morning as well. NAD.

## 2021-02-03 ENCOUNTER — Other Ambulatory Visit: Payer: Self-pay | Admitting: Internal Medicine

## 2021-02-03 DIAGNOSIS — R112 Nausea with vomiting, unspecified: Secondary | ICD-10-CM

## 2021-02-03 DIAGNOSIS — R1013 Epigastric pain: Secondary | ICD-10-CM

## 2021-02-16 ENCOUNTER — Ambulatory Visit: Payer: Self-pay

## 2021-02-21 ENCOUNTER — Ambulatory Visit
Admission: RE | Admit: 2021-02-21 | Discharge: 2021-02-21 | Disposition: A | Discharge status: Home / Self Care | Payer: BC Managed Care – PPO | Source: Ambulatory Visit | Attending: Internal Medicine | Admitting: Internal Medicine

## 2021-02-21 ENCOUNTER — Other Ambulatory Visit: Payer: Self-pay

## 2021-02-21 DIAGNOSIS — R1013 Epigastric pain: Secondary | ICD-10-CM | POA: Insufficient documentation

## 2021-02-21 DIAGNOSIS — R112 Nausea with vomiting, unspecified: Secondary | ICD-10-CM | POA: Insufficient documentation

## 2021-03-11 ENCOUNTER — Other Ambulatory Visit: Payer: Self-pay

## 2021-03-11 ENCOUNTER — Emergency Department: Payer: BC Managed Care – PPO

## 2021-03-11 DIAGNOSIS — R0781 Pleurodynia: Secondary | ICD-10-CM | POA: Diagnosis not present

## 2021-03-11 DIAGNOSIS — Z5321 Procedure and treatment not carried out due to patient leaving prior to being seen by health care provider: Secondary | ICD-10-CM | POA: Insufficient documentation

## 2021-03-11 DIAGNOSIS — R519 Headache, unspecified: Secondary | ICD-10-CM | POA: Insufficient documentation

## 2021-03-11 DIAGNOSIS — M25552 Pain in left hip: Secondary | ICD-10-CM | POA: Diagnosis not present

## 2021-03-11 NOTE — ED Triage Notes (Signed)
Pt states that he was thrown from a horse that was going at full speed, and landed on his head and left hip. Left rib pain, Left hip pain, Head pain. Denies any LOC.

## 2021-03-12 ENCOUNTER — Emergency Department
Admission: EM | Admit: 2021-03-12 | Discharge: 2021-03-12 | Disposition: A | Discharge status: Home / Self Care | Payer: BC Managed Care – PPO | Attending: Emergency Medicine | Admitting: Emergency Medicine

## 2021-03-12 NOTE — ED Notes (Signed)
No answer in lobby when called for repeat vital signs 

## 2021-03-12 NOTE — ED Notes (Signed)
No answer when called for repeat vital signs °

## 2021-08-14 ENCOUNTER — Emergency Department: Payer: BC Managed Care – PPO

## 2021-08-14 ENCOUNTER — Encounter: Payer: Self-pay | Admitting: Internal Medicine

## 2021-08-14 ENCOUNTER — Inpatient Hospital Stay
Admission: EM | Admit: 2021-08-14 | Discharge: 2021-08-16 | DRG: 439 | Disposition: A | Discharge status: Home / Self Care | Payer: BC Managed Care – PPO | Attending: Internal Medicine | Admitting: Internal Medicine

## 2021-08-14 ENCOUNTER — Other Ambulatory Visit: Payer: Self-pay

## 2021-08-14 DIAGNOSIS — K858 Other acute pancreatitis without necrosis or infection: Secondary | ICD-10-CM | POA: Diagnosis not present

## 2021-08-14 DIAGNOSIS — Z20822 Contact with and (suspected) exposure to covid-19: Secondary | ICD-10-CM | POA: Diagnosis present

## 2021-08-14 DIAGNOSIS — R6510 Systemic inflammatory response syndrome (SIRS) of non-infectious origin without acute organ dysfunction: Secondary | ICD-10-CM | POA: Diagnosis present

## 2021-08-14 DIAGNOSIS — E119 Type 2 diabetes mellitus without complications: Secondary | ICD-10-CM

## 2021-08-14 DIAGNOSIS — K219 Gastro-esophageal reflux disease without esophagitis: Secondary | ICD-10-CM | POA: Diagnosis present

## 2021-08-14 DIAGNOSIS — Z87891 Personal history of nicotine dependence: Secondary | ICD-10-CM

## 2021-08-14 DIAGNOSIS — E871 Hypo-osmolality and hyponatremia: Secondary | ICD-10-CM | POA: Diagnosis present

## 2021-08-14 DIAGNOSIS — Z800 Family history of malignant neoplasm of digestive organs: Secondary | ICD-10-CM

## 2021-08-14 DIAGNOSIS — Z79899 Other long term (current) drug therapy: Secondary | ICD-10-CM

## 2021-08-14 DIAGNOSIS — K760 Fatty (change of) liver, not elsewhere classified: Secondary | ICD-10-CM | POA: Diagnosis present

## 2021-08-14 DIAGNOSIS — R1033 Periumbilical pain: Secondary | ICD-10-CM

## 2021-08-14 DIAGNOSIS — Z83438 Family history of other disorder of lipoprotein metabolism and other lipidemia: Secondary | ICD-10-CM

## 2021-08-14 DIAGNOSIS — I10 Essential (primary) hypertension: Secondary | ICD-10-CM | POA: Diagnosis present

## 2021-08-14 DIAGNOSIS — E669 Obesity, unspecified: Secondary | ICD-10-CM | POA: Diagnosis present

## 2021-08-14 DIAGNOSIS — E781 Pure hyperglyceridemia: Secondary | ICD-10-CM | POA: Diagnosis present

## 2021-08-14 DIAGNOSIS — E785 Hyperlipidemia, unspecified: Secondary | ICD-10-CM | POA: Diagnosis present

## 2021-08-14 DIAGNOSIS — Z794 Long term (current) use of insulin: Secondary | ICD-10-CM

## 2021-08-14 DIAGNOSIS — Z6836 Body mass index (BMI) 36.0-36.9, adult: Secondary | ICD-10-CM

## 2021-08-14 DIAGNOSIS — K859 Acute pancreatitis without necrosis or infection, unspecified: Secondary | ICD-10-CM | POA: Diagnosis present

## 2021-08-14 DIAGNOSIS — E1165 Type 2 diabetes mellitus with hyperglycemia: Secondary | ICD-10-CM | POA: Diagnosis present

## 2021-08-14 DIAGNOSIS — Z7984 Long term (current) use of oral hypoglycemic drugs: Secondary | ICD-10-CM

## 2021-08-14 DIAGNOSIS — D72829 Elevated white blood cell count, unspecified: Secondary | ICD-10-CM | POA: Diagnosis present

## 2021-08-14 DIAGNOSIS — R000 Tachycardia, unspecified: Secondary | ICD-10-CM | POA: Diagnosis present

## 2021-08-14 LAB — COMPREHENSIVE METABOLIC PANEL
ALT: 41 U/L (ref 0–44)
AST: 39 U/L (ref 15–41)
Albumin: 4 g/dL (ref 3.5–5.0)
Alkaline Phosphatase: 86 U/L (ref 38–126)
Anion gap: 11 (ref 5–15)
BUN: 11 mg/dL (ref 6–20)
CO2: 23 mmol/L (ref 22–32)
Calcium: 9.1 mg/dL (ref 8.9–10.3)
Chloride: 99 mmol/L (ref 98–111)
Creatinine, Ser: 0.79 mg/dL (ref 0.61–1.24)
GFR, Estimated: >60 mL/min (ref >60)
Glucose, Bld: 245 mg/dL — ABNORMAL HIGH (ref 70–99)
Potassium: 4.1 mmol/L (ref 3.5–5.1)
Sodium: 133 mmol/L — ABNORMAL LOW (ref 135–145)
Total Bilirubin: 1.2 mg/dL (ref 0.3–1.2)
Total Protein: 7.3 g/dL (ref 6.5–8.1)

## 2021-08-14 LAB — CBC
HCT: 44.9 % (ref 39.0–52.0)
Hemoglobin: 14.8 g/dL (ref 13.0–17.0)
MCH: 27.3 pg (ref 26.0–34.0)
MCHC: 33 g/dL (ref 30.0–36.0)
MCV: 82.8 fL (ref 80.0–100.0)
Platelets: 319 10*3/uL (ref 150–400)
RBC: 5.42 MIL/uL (ref 4.22–5.81)
RDW: 13.2 % (ref 11.5–15.5)
WBC: 14 10*3/uL — ABNORMAL HIGH (ref 4.0–10.5)
nRBC: 0 % (ref 0.0–0.2)

## 2021-08-14 LAB — URINALYSIS, COMPLETE (UACMP) WITH MICROSCOPIC
Bacteria, UA: NONE SEEN
Bilirubin Urine: NEGATIVE
Glucose, UA: >=500 mg/dL — AB
Hgb urine dipstick: NEGATIVE
Ketones, ur: 20 mg/dL — AB
Leukocytes,Ua: NEGATIVE
Nitrite: NEGATIVE
Protein, ur: 100 mg/dL — AB
Specific Gravity, Urine: 1.031 — ABNORMAL HIGH (ref 1.005–1.030)
pH: 8 (ref 5.0–8.0)

## 2021-08-14 LAB — GLUCOSE, CAPILLARY: Glucose-Capillary: 225 mg/dL — ABNORMAL HIGH (ref 70–99)

## 2021-08-14 LAB — LIPASE, BLOOD: Lipase: 48 U/L (ref 11–51)

## 2021-08-14 MED ORDER — FLUTICASONE PROPIONATE 50 MCG/ACT NA SUSP
1.0000 | Freq: Two times a day (BID) | NASAL | Status: DC | PRN
  Filled 2021-08-14: qty 16

## 2021-08-14 MED ORDER — FENTANYL CITRATE PF 50 MCG/ML IJ SOSY
100.0000 ug | PREFILLED_SYRINGE | Freq: Once | INTRAMUSCULAR | Status: AC
  Administered 2021-08-14: 100 ug via INTRAVENOUS
  Filled 2021-08-14: qty 2

## 2021-08-14 MED ORDER — INSULIN ASPART 100 UNIT/ML IJ SOLN
0.0000 [IU] | Freq: Every day | INTRAMUSCULAR | Status: DC
  Administered 2021-08-14: 2 [IU] via SUBCUTANEOUS
  Administered 2021-08-15: 4 [IU] via SUBCUTANEOUS
  Filled 2021-08-14 (×2): qty 1

## 2021-08-14 MED ORDER — SODIUM CHLORIDE 0.9 % IV SOLN: INTRAVENOUS | Status: AC

## 2021-08-14 MED ORDER — INSULIN ASPART 100 UNIT/ML IJ SOLN
0.0000 [IU] | Freq: Three times a day (TID) | INTRAMUSCULAR | Status: DC
  Administered 2021-08-15: 8 [IU] via SUBCUTANEOUS
  Administered 2021-08-15: 5 [IU] via SUBCUTANEOUS
  Administered 2021-08-15: 8 [IU] via SUBCUTANEOUS
  Administered 2021-08-16: 5 [IU] via SUBCUTANEOUS
  Filled 2021-08-14 (×4): qty 1

## 2021-08-14 MED ORDER — LORATADINE 10 MG PO TABS
10.0000 mg | ORAL_TABLET | Freq: Every day | ORAL | Status: DC
  Administered 2021-08-15 – 2021-08-16 (×2): 10 mg via ORAL
  Filled 2021-08-14 (×3): qty 1

## 2021-08-14 MED ORDER — ACETAMINOPHEN 650 MG RE SUPP
650.0000 mg | Freq: Four times a day (QID) | RECTAL | Status: DC | PRN
Start: 2021-08-14 — End: 2021-08-16

## 2021-08-14 MED ORDER — LACTATED RINGERS IV BOLUS
1000.0000 mL | Freq: Once | INTRAVENOUS | Status: AC
  Administered 2021-08-14: 1000 mL via INTRAVENOUS

## 2021-08-14 MED ORDER — ONDANSETRON HCL 4 MG/2ML IJ SOLN
4.0000 mg | Freq: Once | INTRAMUSCULAR | Status: AC
  Administered 2021-08-14: 4 mg via INTRAVENOUS
  Filled 2021-08-14: qty 2

## 2021-08-14 MED ORDER — ATORVASTATIN CALCIUM 20 MG PO TABS
40.0000 mg | ORAL_TABLET | Freq: Every day | ORAL | Status: DC
  Administered 2021-08-14 – 2021-08-15 (×2): 40 mg via ORAL
  Filled 2021-08-14 (×4): qty 2

## 2021-08-14 MED ORDER — LISINOPRIL 10 MG PO TABS: 10.0000 mg | ORAL_TABLET | Freq: Two times a day (BID) | ORAL | Status: DC

## 2021-08-14 MED ORDER — OMEGA-3-ACID ETHYL ESTERS 1 G PO CAPS
2.0000 g | ORAL_CAPSULE | Freq: Every day | ORAL | Status: DC
  Administered 2021-08-15 – 2021-08-16 (×2): 2 g via ORAL
  Filled 2021-08-14 (×3): qty 2

## 2021-08-14 MED ORDER — IOHEXOL 300 MG/ML  SOLN
80.0000 mL | Freq: Once | INTRAMUSCULAR | Status: AC | PRN
  Administered 2021-08-14: 80 mL via INTRAVENOUS

## 2021-08-14 MED ORDER — ALBUTEROL SULFATE (2.5 MG/3ML) 0.083% IN NEBU: 3.0000 mL | INHALATION_SOLUTION | Freq: Four times a day (QID) | RESPIRATORY_TRACT | Status: DC | PRN

## 2021-08-14 MED ORDER — LISINOPRIL 20 MG PO TABS
20.0000 mg | ORAL_TABLET | Freq: Two times a day (BID) | ORAL | Status: DC
Start: 2021-08-14 — End: 2021-08-16
  Administered 2021-08-14 – 2021-08-16 (×4): 20 mg via ORAL
  Filled 2021-08-14 (×4): qty 1

## 2021-08-14 MED ORDER — ENOXAPARIN SODIUM 60 MG/0.6ML IJ SOSY
0.5000 mg/kg | PREFILLED_SYRINGE | INTRAMUSCULAR | Status: DC
  Administered 2021-08-14 – 2021-08-15 (×2): 60 mg via SUBCUTANEOUS
  Filled 2021-08-14 (×2): qty 0.6

## 2021-08-14 MED ORDER — MORPHINE SULFATE (PF) 2 MG/ML IV SOLN
2.0000 mg | INTRAVENOUS | Status: DC | PRN
  Administered 2021-08-14: 2 mg via INTRAVENOUS
  Filled 2021-08-14: qty 1

## 2021-08-14 MED ORDER — ONDANSETRON 4 MG PO TBDP: 4.0000 mg | ORAL_TABLET | Freq: Three times a day (TID) | ORAL | Status: DC | PRN

## 2021-08-14 MED ORDER — ACETAMINOPHEN 325 MG PO TABS
650.0000 mg | ORAL_TABLET | Freq: Four times a day (QID) | ORAL | Status: DC | PRN
  Administered 2021-08-14 – 2021-08-16 (×5): 650 mg via ORAL
  Filled 2021-08-14 (×5): qty 2

## 2021-08-14 MED ORDER — INSULIN GLARGINE-YFGN 100 UNIT/ML ~~LOC~~ SOLN
7.0000 [IU] | Freq: Every day | SUBCUTANEOUS | Status: DC
  Administered 2021-08-14: 7 [IU] via SUBCUTANEOUS
  Filled 2021-08-14 (×2): qty 0.07

## 2021-08-14 MED ORDER — FENOFIBRATE 160 MG PO TABS
160.0000 mg | ORAL_TABLET | Freq: Every day | ORAL | Status: DC
  Administered 2021-08-15 – 2021-08-16 (×2): 160 mg via ORAL
  Filled 2021-08-14 (×3): qty 1

## 2021-08-14 NOTE — ED Provider Notes (Signed)
? ?[facility] ?Provider Note ? ? ? Event Date/Time  ? First MD Initiated Contact with Patient 08/14/21 1320   ?  (approximate) ? ? ?History  ? ?Abdominal Pain ? ? ?HPI ? ?Dale Mendoza is a 41 y.o. male who presents to the ED for evaluation of Abdominal Pain ?  ?I reviewed GI consult from September.  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  DM, HTN and obesity. ? ?Patient presents to the ED, accompanied by his girlfriend, for evaluation of abdominal pain, nausea.  Symptoms started in the past 4 days.  Reports increasing pain constantly over the past 4 days.  Reports decreased urinary output and poor intake with nausea.  Reports feels similar to pancreatitis in the past. ? ? ?Physical Exam  ? ?Triage Vital Signs: ?ED Triage Vitals  ?Enc Vitals Group  ?   BP 08/14/21 1308 (!) 165/100  ?   Pulse Rate 08/14/21 1306 (!) 124  ?   Resp 08/14/21 1306 20  ?   Temp 08/14/21 1306 98.6 ?F (37 ?C)  ?   Temp Source 08/14/21 1306 Oral  ?   SpO2 08/14/21 1306 95 %  ?   Weight 08/14/21 1306 270 lb (122.5 kg)  ?   Height 08/14/21 1306 6' (1.829 m)  ?   Head Circumference --   ?   Peak Flow --   ?   Pain Score 08/14/21 1306 8  ?   Pain Loc --   ?   Pain Edu? --   ?   Excl. in GC? --   ? ? ?Most recent vital signs: ?Vitals:  ? 08/14/21 1306 08/14/21 1308  ?BP:  (!) 165/100  ?Pulse: (!) 124   ?Resp: 20   ?Temp: 98.6 ?F (37 ?C)   ?SpO2: 95%   ? ? ?General: Awake, no distress.  Obese.  Appears uncomfortable.  Warm to the touch and sweaty, but not febrile when I take his temperature orally in the room. ?CV:  Good peripheral perfusion.  Tachycardic and regular ?Resp:  Normal effort.  ?Abd:  No distention.  Diffuse tenderness with voluntary guarding making deeper palpation difficult to appreciate.  No clearly peritoneal features. ?MSK:  No deformity noted.  ?Neuro:  No focal deficits appreciated. ?Other:   ? ? ?ED Results / Procedures / Treatments  ? ?Labs ?(all labs  ordered are listed, but only abnormal results are displayed) ?Labs Reviewed  ?CBC - Abnormal; Notable for the following components:  ?    Result Value  ? WBC 14.0 (*)   ? All other components within normal limits  ?COMPREHENSIVE METABOLIC PANEL - Abnormal; Notable for the following components:  ? Sodium 133 (*)   ? Glucose, Bld 245 (*)   ? All other components within normal limits  ?URINALYSIS, COMPLETE (UACMP) WITH MICROSCOPIC - Abnormal; Notable for the following components:  ? Color, Urine YELLOW (*)   ? APPearance CLEAR (*)   ? Specific Gravity, Urine 1.031 (*)   ? Glucose, UA >=500 (*)   ? Ketones, ur 20 (*)   ? Protein, ur 100 (*)   ? All other components within normal limits  ?LIPASE, BLOOD  ? ? ?EKG ? ? ?RADIOLOGY ?CT abdomen/pelvis reviewed by me with inflammatory stranding around pancreatic tail ? ?Official radiology report(s): ?CT ABDOMEN PELVIS W CONTRAST ? ?Result Date: 08/14/2021 ?CLINICAL DATA:  Central abdominal pain. Vomiting. Subjective fever. Leukocytosis. EXAM: CT ABDOMEN AND PELVIS WITH CONTRAST TECHNIQUE:  Multidetector CT imaging of the abdomen and pelvis was performed using the standard protocol following bolus administration of intravenous contrast. RADIATION DOSE REDUCTION: This exam was performed according to the departmental dose-optimization program which includes automated exposure control, adjustment of the mA and/or kV according to patient size and/or use of iterative reconstruction technique. CONTRAST:  46mL OMNIPAQUE IOHEXOL 300 MG/ML  SOLN COMPARISON:  04/15/2019. FINDINGS: Lower chest: Clear lung bases. Hepatobiliary: Liver mildly enlarged, 30 cm transversely, with diffuse decreased attenuation consistent with fatty infiltration. No liver mass or focal lesion. Normal gallbladder. No bile duct dilation. Pancreas: Subtle hazy and reticular opacity noted adjacent to the pancreatic tail. Pancreas otherwise unremarkable. Homogeneous pancreatic enhancement. No mass or duct dilation.  Spleen: Normal in size without focal abnormality. Adrenals/Urinary Tract: No adrenal masses. Kidneys normal size, orientation and position. 2 cm left upper pole renal cyst. No other renal masses, no stones and no hydronephrosis. Normal ureters. Normal bladder. Stomach/Bowel: Stomach is within normal limits. Appendix appears normal. No evidence of bowel wall thickening, distention, or inflammatory changes. Vascular/Lymphatic: No significant vascular abnormality. Widely patent portal vein, superior mesenteric vein and splenic vein. No enlarged lymph nodes. Reproductive: Unremarkable. Other: No abdominal wall hernia or abnormality. No abdominopelvic ascites. Musculoskeletal: Old left rib fractures. No acute fractures. No bone lesions. IMPRESSION: 1. Subtle inflammatory change adjacent to the pancreatic tail. There was significantly more inflammatory change adjacent to the pancreatic tail on the prior CT. Although this could reflect a chronic change, given the history, mild uncomplicated, acute pancreatitis is suspected. 2. No other evidence of an acute abnormality. 3. Mild hepatomegaly and diffuse hepatic steatosis similar to the prior study. Electronically Signed   By: Amie Portland M.D.   On: 08/14/2021 15:00   ? ?PROCEDURES and INTERVENTIONS: ? ?Procedures ? ?Medications  ?lactated ringers bolus 1,000 mL (0 mLs Intravenous Stopped 08/14/21 1506)  ?ondansetron (ZOFRAN) injection 4 mg (4 mg Intravenous Given 08/14/21 1355)  ?fentaNYL (SUBLIMAZE) injection 100 mcg (100 mcg Intravenous Given 08/14/21 1356)  ?iohexol (OMNIPAQUE) 300 MG/ML solution 80 mL (80 mLs Intravenous Contrast Given 08/14/21 1420)  ? ? ? ?IMPRESSION / MDM / ASSESSMENT AND PLAN / ED COURSE  ?I reviewed the triage vital signs and the nursing notes. ? ?41 year old male with a history of pancreatitis presents to the ED with evidence of recurrence and require medical observation admission.  He is tachycardic, diaphoretic and appears uncomfortable.  Remained  stable.  Blood work with leukocytosis.  Metabolic panel with hyperglycemia without acidosis suggest DKA.  Urine with ketonuria suggestive of dehydration.  Surprisingly, his lipase is normal.  CT obtained due to his pain, tenderness and leukocytosis.  Does have evidence of pancreatic tail pancreatitis without evidence of further intra-abdominal pathology such as cholecystitis, appendicitis, SBO or diverticulitis.  Considering his continued pain, tachycardia we will consult with medicine for admission. ? ?Clinical Course as of 08/14/21 1510  ?Wynelle Link Aug 14, 2021  ?1448 Reassessed.  Still tachycardic but appears more comfortable.  Still tender on exam.  Awaiting CT. [DS]  ?  ?Clinical Course User Index ?[DS] Delton Prairie, MD  ? ? ? ?FINAL CLINICAL IMPRESSION(S) / ED DIAGNOSES  ? ?Final diagnoses:  ?Other acute pancreatitis without infection or necrosis  ?Periumbilical abdominal pain  ? ? ? ?Rx / DC Orders  ? ?ED Discharge Orders   ? ? None  ? ?  ? ? ? ?Note:  This document was prepared using Dragon voice recognition software and may include unintentional dictation errors. ?  ?Katrinka Blazing,  Domingo Dimes, MD ?08/14/21 1511 ? ?

## 2021-08-14 NOTE — Progress Notes (Signed)
PHARMACIST - PHYSICIAN COMMUNICATION ? ?CONCERNING:  Enoxaparin (Lovenox) for DVT Prophylaxis  ? ? ?RECOMMENDATION: ?Patient was prescribed enoxaprin 40mg  q24 hours for VTE prophylaxis.  ? ?Filed Weights  ? 08/14/21 1306 08/14/21 2011  ?Weight: 122.5 kg (270 lb) 122.1 kg (269 lb 2.9 oz)  ? ? ?Body mass index is 36.51 kg/m?. ? ?Estimated Creatinine Clearance: 168.9 mL/min (by C-G formula based on SCr of 0.79 mg/dL). ? ? ?Based on [facility] policy patient is candidate for enoxaparin 0.5mg /kg TBW SQ every 24 hours based on BMI being >30. ? ?DESCRIPTION: ?Pharmacy has adjusted enoxaparin dose per [facility] policy. ? ?Patient is now receiving enoxaparin 60 mg every 24 hours  ? ? ?Thank you for allowing pharmacy to be a part of this patient?s care. ? ?[facility], PharmD, MS PGPM ?Clinical Pharmacist ?08/14/2021 ?8:14 PM ? ? ?

## 2021-08-14 NOTE — ED Notes (Signed)
Report from Jonny Ruiz, RN was that transport has been notified and that report has already been reviewed with the nurse upstairs.  ?

## 2021-08-14 NOTE — H&P (Signed)
?History and Physical  ? ? ?CLAIRE BRIDGE MWN:[id number] DOB: 1981-02-09 DOA: 08/14/2021 ? ?PCP:Dr. Okey Dupre, Dr. Gershon Crane, endocrine ?Patient coming from: Home ?Chief Complaint: Abdominal pain ? ?HPI: Dale Mendoza is a 41 y.o. male with medical history significant of DM2, GERD, HLD, HTN, pancreatitis who presents with abdominal pain since Thursday.  He notes that in the evening on that day he developed sharp upper abdomen and left upper quadrant abdominal pain.  He has a history of pancreatitis and knew that was likely what it was, but symptoms were manageable at the time.  However, the pain worsened on Saturday night and this morning he felt like he developed fever and chills, so he came in.  He further notes nausea and diarrhea this morning.  He has a long FH of elevated cholesterol and he himself has elevated triglycerides which are the cause of his chronic disease.  He denies any ETOH use.  He reports taking all of his medications.  He requests not getting dilaudid as he did not like how it made him feel.  ? ?ED Course: Lipase only 14, however, CT abdomen did show stranding around the rail of the pancreas which is consistent with his symptoms.  WBC is 14, Na mildly low.  Renal function is stable.  Last lipid profile from 2020 showed Tg of 1900.  ? ?Review of Systems: As per HPI otherwise all other systems reviewed and are negative. ? ? ?Past Medical History:  ?Diagnosis Date  ? Allergy   ? seasonal  ? Diabetes mellitus without complication (HCC)   ? GERD (gastroesophageal reflux disease)   ? Hyperlipidemia   ? Hypertension   ? Pancreatitis   ? ? ?Past Surgical History:  ?Procedure Laterality Date  ? HERNIA REPAIR    ? TONSILLECTOMY    ? ? ?Social History ? reports that he has quit smoking. His smoking use included cigarettes. He has never used smokeless tobacco. He reports current alcohol use. He reports that he does not use drugs. ? ?No Known Allergies ? ?Family History  ?Problem Relation Age of Onset  ?  Hyperlipidemia Father   ? Colon cancer Father   ? Pancreatic cancer Paternal Grandfather   ? ? ? ?Prior to Admission medications   ?Medication Sig Start Date End Date Taking? Authorizing Provider  ?albuterol (PROVENTIL HFA;VENTOLIN HFA) 108 (90 Base) MCG/ACT inhaler Inhale 1-2 puffs into the lungs every 6 (six) hours as needed for wheezing or shortness of breath. 10/04/15   Faythe Ghee, PA-C  ?atorvastatin (LIPITOR) 40 MG tablet TAKE 1 TABLET BY MOUTH DAILY 02/09/17   Sherrie Mustache Roselyn Bering, PA-C  ?Cetirizine HCl (ZYRTEC ALLERGY) 10 MG CAPS Take 1 tablet by mouth daily.    [provider]  ?fenofibrate (TRICOR) 145 MG tablet Take 1 tablet (145 mg total) by mouth daily. Will need fasting labs in july 12/06/16   Fisher, Roselyn Bering, PA-C  ?fluticasone (FLONASE) 50 MCG/ACT nasal spray Place 1 spray into both nostrils 2 (two) times daily. 10/04/15   Fisher, Roselyn Bering, PA-C  ?glimepiride (AMARYL) 4 MG tablet Take 4 mg by mouth daily. 06/12/17 06/12/18  [provider]  ?insulin glargine (LANTUS) 100 unit/mL SOPN Inject 0.15 mLs (15 Units total) into the skin at bedtime. 12/18/16   Houston Siren, MD  ?Insulin Pen Needle (NOVOFINE) 30G X 8 MM MISC Inject 10 each into the skin as needed. 12/18/16   Houston Siren, MD  ?lisinopril (PRINIVIL,ZESTRIL) 10 MG tablet TAKE 1 TABLET BY  MOUTH TWICE DAILY 01/10/17   Sherrie MustacheFisher, Roselyn BeringSusan W, PA-C  ?loratadine (CLARITIN) 10 MG tablet Take 1 tablet (10 mg total) by mouth daily. 07/21/15   Betancourt, Jarold Songina A, NP  ?metformin (FORTAMET) 1000 MG (OSM) 24 hr tablet Take 1 tablet (1,000 mg total) by mouth 2 (two) times daily with a meal. 12/18/16   Sainani, Rolly PancakeVivek J, MD  ?Omega-3 Fatty Acids (FISH OIL) 1200 MG CAPS Take by mouth 2 (two) times daily.    [provider]  ?ondansetron (ZOFRAN ODT) 4 MG disintegrating tablet Take 1 tablet (4 mg total) by mouth every 8 (eight) hours as needed. 04/16/19   Nita SickleVeronese, Cecil, MD  ?ONE [facility] ULTRA TEST test strip USE AS DIRECTED THREE TIMES A DAY  12/29/16   Sherrie MustacheFisher, Roselyn BeringSusan W, PA-C  ?oxyCODONE-acetaminophen (PERCOCET) 5-325 MG tablet Take 1 tablet by mouth every 4 (four) hours as needed. 04/16/19   Nita SickleVeronese, [place], MD  ? ? ?Physical Exam: ?Vitals:  ? 08/14/21 1306 08/14/21 1308  ?BP:  (!) 165/100  ?Pulse: (!) 124   ?Resp: 20   ?Temp: 98.6 ?F (37 ?C)   ?TempSrc: Oral   ?SpO2: 95%   ?Weight: 122.5 kg   ?Height: 6' (1.829 m)   ? ? ?Constitutional: NAD, calm, comfortable ?Eyes: PERRL, lids and conjunctivae normal, no scleral icterus, + conjunctival injection bilaterally ?ENMT: Mucous membranes are dry ?Neck: normal, supple, no lymphadenopathy ?Respiratory: clear to auscultation bilaterally, no wheezing or rales, easy work of breathing.  ?Cardiovascular: Tachycardic, regular, no murmur or rubs noted ?Abdomen: + TTP in the upper mid and left quadrants, mild distention, guarding present - voluntary, +BS ?Musculoskeletal: normal tone and bulk for age ?Skin: no rashes, lesions, ulcers on exposed skin ?Psychiatric: Normal judgment and insight. Alert and oriented x 3. Normal mood.  ? ?Labs on Admission: I have personally reviewed following labs and imaging studies ? ?CBC: ?Recent Labs  ?Lab 08/14/21 ?1308  ?WBC 14.0*  ?HGB 14.8  ?HCT 44.9  ?MCV 82.8  ?PLT 319  ? ? ?Basic Metabolic Panel: ?Recent Labs  ?Lab 08/14/21 ?1308  ?NA 133*  ?K 4.1  ?CL 99  ?CO2 23  ?GLUCOSE 245*  ?BUN 11  ?CREATININE 0.79  ?CALCIUM 9.1  ? ? ?GFR: ?Estimated Creatinine Clearance: 169.3 mL/min (by C-G formula based on SCr of 0.79 mg/dL). ? ?Liver Function Tests: ?Recent Labs  ?Lab 08/14/21 ?1308  ?AST 39  ?ALT 41  ?ALKPHOS 86  ?BILITOT 1.2  ?PROT 7.3  ?ALBUMIN 4.0  ? ? ?Urine analysis: ?   ?Component Value Date/Time  ? COLORURINE YELLOW (A) 08/14/2021 1308  ? APPEARANCEUR CLEAR (A) 08/14/2021 1308  ? APPEARANCEUR Clear 09/05/2014 0949  ? LABSPEC 1.031 (H) 08/14/2021 1308  ? LABSPEC 1.040 09/05/2014 0949  ? PHURINE 8.0 08/14/2021 1308  ? GLUCOSEU >=500 (A) 08/14/2021 1308  ? GLUCOSEU >=500  09/05/2014 0949  ? HGBUR NEGATIVE 08/14/2021 1308  ? BILIRUBINUR NEGATIVE 08/14/2021 1308  ? BILIRUBINUR Negative 09/05/2014 0949  ? KETONESUR 20 (A) 08/14/2021 1308  ? PROTEINUR 100 (A) 08/14/2021 1308  ? NITRITE NEGATIVE 08/14/2021 1308  ? LEUKOCYTESUR NEGATIVE 08/14/2021 1308  ? LEUKOCYTESUR Negative 09/05/2014 0949  ? ? ?Radiological Exams on Admission: ?CT ABDOMEN PELVIS W CONTRAST ? ?Result Date: 08/14/2021 ?CLINICAL DATA:  Central abdominal pain. Vomiting. Subjective fever. Leukocytosis. EXAM: CT ABDOMEN AND PELVIS WITH CONTRAST TECHNIQUE: Multidetector CT imaging of the abdomen and pelvis was performed using the standard protocol following bolus administration of intravenous contrast. RADIATION DOSE REDUCTION: This exam was performed  according to the departmental dose-optimization program which includes automated exposure control, adjustment of the mA and/or kV according to patient size and/or use of iterative reconstruction technique. CONTRAST:  9mL OMNIPAQUE IOHEXOL 300 MG/ML  SOLN COMPARISON:  04/15/2019. FINDINGS: Lower chest: Clear lung bases. Hepatobiliary: Liver mildly enlarged, 30 cm transversely, with diffuse decreased attenuation consistent with fatty infiltration. No liver mass or focal lesion. Normal gallbladder. No bile duct dilation. Pancreas: Subtle hazy and reticular opacity noted adjacent to the pancreatic tail. Pancreas otherwise unremarkable. Homogeneous pancreatic enhancement. No mass or duct dilation. Spleen: Normal in size without focal abnormality. Adrenals/Urinary Tract: No adrenal masses. Kidneys normal size, orientation and position. 2 cm left upper pole renal cyst. No other renal masses, no stones and no hydronephrosis. Normal ureters. Normal bladder. Stomach/Bowel: Stomach is within normal limits. Appendix appears normal. No evidence of bowel wall thickening, distention, or inflammatory changes. Vascular/Lymphatic: No significant vascular abnormality. Widely patent portal vein,  superior mesenteric vein and splenic vein. No enlarged lymph nodes. Reproductive: Unremarkable. Other: No abdominal wall hernia or abnormality. No abdominopelvic ascites. Musculoskeletal: Old left rib fractures. No acut

## 2021-08-14 NOTE — ED Triage Notes (Signed)
Pt in with co left sided abd pain, states hx of pancreatitis. States symptoms feel the same, symptoms started Thursday.  ?

## 2021-08-14 NOTE — ED Notes (Signed)
Request made for transport to the floor ?

## 2021-08-15 DIAGNOSIS — Z87891 Personal history of nicotine dependence: Secondary | ICD-10-CM | POA: Diagnosis not present

## 2021-08-15 DIAGNOSIS — D72829 Elevated white blood cell count, unspecified: Secondary | ICD-10-CM | POA: Diagnosis present

## 2021-08-15 DIAGNOSIS — E1165 Type 2 diabetes mellitus with hyperglycemia: Secondary | ICD-10-CM | POA: Diagnosis present

## 2021-08-15 DIAGNOSIS — R000 Tachycardia, unspecified: Secondary | ICD-10-CM | POA: Diagnosis present

## 2021-08-15 DIAGNOSIS — K858 Other acute pancreatitis without necrosis or infection: Secondary | ICD-10-CM | POA: Diagnosis present

## 2021-08-15 DIAGNOSIS — I10 Essential (primary) hypertension: Secondary | ICD-10-CM | POA: Diagnosis present

## 2021-08-15 DIAGNOSIS — Z794 Long term (current) use of insulin: Secondary | ICD-10-CM | POA: Diagnosis not present

## 2021-08-15 DIAGNOSIS — E871 Hypo-osmolality and hyponatremia: Secondary | ICD-10-CM | POA: Diagnosis present

## 2021-08-15 DIAGNOSIS — Z20822 Contact with and (suspected) exposure to covid-19: Secondary | ICD-10-CM | POA: Diagnosis present

## 2021-08-15 DIAGNOSIS — K859 Acute pancreatitis without necrosis or infection, unspecified: Secondary | ICD-10-CM | POA: Diagnosis not present

## 2021-08-15 DIAGNOSIS — Z79899 Other long term (current) drug therapy: Secondary | ICD-10-CM | POA: Diagnosis not present

## 2021-08-15 DIAGNOSIS — E781 Pure hyperglyceridemia: Secondary | ICD-10-CM | POA: Diagnosis present

## 2021-08-15 DIAGNOSIS — Z7984 Long term (current) use of oral hypoglycemic drugs: Secondary | ICD-10-CM | POA: Diagnosis not present

## 2021-08-15 DIAGNOSIS — Z6836 Body mass index (BMI) 36.0-36.9, adult: Secondary | ICD-10-CM | POA: Diagnosis not present

## 2021-08-15 DIAGNOSIS — K760 Fatty (change of) liver, not elsewhere classified: Secondary | ICD-10-CM | POA: Diagnosis present

## 2021-08-15 DIAGNOSIS — Z83438 Family history of other disorder of lipoprotein metabolism and other lipidemia: Secondary | ICD-10-CM | POA: Diagnosis not present

## 2021-08-15 DIAGNOSIS — Z800 Family history of malignant neoplasm of digestive organs: Secondary | ICD-10-CM | POA: Diagnosis not present

## 2021-08-15 DIAGNOSIS — R6510 Systemic inflammatory response syndrome (SIRS) of non-infectious origin without acute organ dysfunction: Secondary | ICD-10-CM | POA: Diagnosis present

## 2021-08-15 DIAGNOSIS — E669 Obesity, unspecified: Secondary | ICD-10-CM | POA: Diagnosis present

## 2021-08-15 DIAGNOSIS — K219 Gastro-esophageal reflux disease without esophagitis: Secondary | ICD-10-CM | POA: Diagnosis present

## 2021-08-15 LAB — BASIC METABOLIC PANEL
Anion gap: 12 (ref 5–15)
BUN: 9 mg/dL (ref 6–20)
CO2: 24 mmol/L (ref 22–32)
Calcium: 8.8 mg/dL — ABNORMAL LOW (ref 8.9–10.3)
Chloride: 98 mmol/L (ref 98–111)
Creatinine, Ser: 0.69 mg/dL (ref 0.61–1.24)
GFR, Estimated: >60 mL/min (ref >60)
Glucose, Bld: 237 mg/dL — ABNORMAL HIGH (ref 70–99)
Potassium: 3.6 mmol/L (ref 3.5–5.1)
Sodium: 134 mmol/L — ABNORMAL LOW (ref 135–145)

## 2021-08-15 LAB — CBC
HCT: 42.1 % (ref 39.0–52.0)
Hemoglobin: 13.8 g/dL (ref 13.0–17.0)
MCH: 27.5 pg (ref 26.0–34.0)
MCHC: 32.8 g/dL (ref 30.0–36.0)
MCV: 83.9 fL (ref 80.0–100.0)
Platelets: 243 10*3/uL (ref 150–400)
RBC: 5.02 MIL/uL (ref 4.22–5.81)
RDW: 13.4 % (ref 11.5–15.5)
WBC: 12.9 10*3/uL — ABNORMAL HIGH (ref 4.0–10.5)
nRBC: 0 % (ref 0.0–0.2)

## 2021-08-15 LAB — HEMOGLOBIN A1C
Hgb A1c MFr Bld: 11.6 % — ABNORMAL HIGH (ref 4.8–5.6)
Mean Plasma Glucose: 286.22 mg/dL

## 2021-08-15 LAB — HIV ANTIBODY (ROUTINE TESTING W REFLEX): HIV Screen 4th Generation wRfx: NONREACTIVE

## 2021-08-15 LAB — LIPID PANEL
Cholesterol: 221 mg/dL — ABNORMAL HIGH (ref 0–200)
HDL: 36 mg/dL — ABNORMAL LOW (ref >40)
LDL Cholesterol: UNDETERMINED mg/dL (ref 0–99)
Total CHOL/HDL Ratio: 6.1 RATIO
Triglycerides: 736 mg/dL — ABNORMAL HIGH (ref <150)
VLDL: UNDETERMINED mg/dL (ref 0–40)

## 2021-08-15 LAB — LACTIC ACID, PLASMA: Lactic Acid, Venous: 1.6 mmol/L (ref 0.5–1.9)

## 2021-08-15 LAB — GLUCOSE, CAPILLARY
Glucose-Capillary: 250 mg/dL — ABNORMAL HIGH (ref 70–99)
Glucose-Capillary: 282 mg/dL — ABNORMAL HIGH (ref 70–99)
Glucose-Capillary: 261 mg/dL — ABNORMAL HIGH (ref 70–99)
Glucose-Capillary: 301 mg/dL — ABNORMAL HIGH (ref 70–99)

## 2021-08-15 LAB — PROCALCITONIN: Procalcitonin: <0.1 ng/mL

## 2021-08-15 LAB — LDL CHOLESTEROL, DIRECT: Direct LDL: 62.8 mg/dL (ref 0–99)

## 2021-08-15 MED ORDER — LACTATED RINGERS IV SOLN: INTRAVENOUS | Status: DC

## 2021-08-15 MED ORDER — INSULIN GLARGINE-YFGN 100 UNIT/ML ~~LOC~~ SOLN
12.0000 [IU] | Freq: Every day | SUBCUTANEOUS | Status: DC
  Administered 2021-08-15: 12 [IU] via SUBCUTANEOUS
  Filled 2021-08-15: qty 0.12

## 2021-08-15 NOTE — Assessment & Plan Note (Signed)
-  Continue home lisinopril 

## 2021-08-15 NOTE — Assessment & Plan Note (Signed)
-  Continue Lipitor °

## 2021-08-15 NOTE — Progress Notes (Signed)
?Progress Note ? ? ?Patient: Dale Mendoza HGD:[id number] DOB: [birth year] DOA: 08/14/2021     0 ?DOS: the patient was seen and examined on 08/15/2021 ?  ?Brief hospital course: ?41 y.o. 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   ? ?Evaluation in the ED revealed a normal lipase level, however CT of the abdomen did show stranding around the tail of the pancreas consistent with acute pancreatitis.  Labs otherwise notable for leukocytosis, mild hyponatremia. ? ?Admitted for acute pancreatitis. ? ? ? ? ?Assessment and Plan: ?* Acute recurrent pancreatitis ?Mildly improved since admission.  Tolerating diet but continues to have epigastric pain and nausea, hot spells with profuse sweating, remains tachycardic.   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  TGs 736 this admission, has previously had TGs over 1900. ? ?3/26: Reported tolerating full liquid diet this morning.  Still having episodes of profuse sweating which he says do not typically happen with his bouts of pancreatitis. ? ?-- Continue IV fluids given tachycardia ?-- Advance diet to soft ?-- As needed analgesia and antiemetics  ? ? ?Leukocytosis ?Suspect reactive in the setting of acute pancreatitis.  Patient's not having fevers here but reported them at home and continues to have episodes of profuse sweats. ?--Check procalcitonin and lactate.   ?--Monitor CBC. ?--Defer antibiotics for now and monitor clinically ? ?WBC improving: 14.0 >> 12.9 ? ?SIRS (systemic inflammatory response syndrome) (HCC) ?Due to acute pancreatitis.  No evidence of infection.  Presented with tachycardia and leukocytosis, both persistent today.   ?-- Monitor fever curve and CBC ?-- IV fluids given ongoing  tachycardia ? ?Tachycardia ?Due to acute pancreatitis.  Continue IV fluids and monitor. ? ?Familial hypertriglyceridemia ?Complicated by episodes of recurrent acute pancreatitis.  Current triglycerides 236. ?-- Continue fenofibrate ? ?Hypertension ?Continue home lisinopril ? ?Hyperlipidemia ?Continue Lipitor ? ?Diabetes mellitus without complication (HCC) ?Home glimepiride and metformin are on hold. ?Basal insulin was resumed at half usual dose on admission. ?3/26: CBGs in the 200s ?--Increase basal to 12 units ?-- Continue sliding scale NovoLog ?-- Titrate insulin for goal 140-180 ? ? ? ? ?  ? ?Subjective: Patient awake sitting up in bed with wife at bedside when seen today.  He reports ongoing episodes of profuse sweating.  Says this is not typical of his prior pancreatitis episodes.  They are concerned he may have an infection.  He did tolerate full liquids and agreeable to try soft diet.  Continues to have epigastric pain although mildly improved. ? ?Physical Exam: ?Vitals:  ? 08/15/21 0018 08/15/21 0422 08/15/21 0838 08/15/21 1226  ?BP: (!) 147/89 (!) 147/82 133/83 137/80  ?Pulse: (!) 115 (!) 113 (!) 114 (!) 103  ?Resp: 18 18 18 16   ?Temp: 98.5 ?F (36.9 ?C) 98.2 ?F (36.8 ?C) 98 ?F (36.7 ?C) 97.7 ?F (36.5 ?C)  ?TempSrc:   Oral   ?SpO2: 96% 93% 95% 95%  ?Weight:      ?Height:      ? ?General exam: awake, alert, no acute distress ?HEENT: Diaphoretic face and forehead, moist mucus membranes, hearing grossly normal  ?Respiratory system: CTAB, no wheezes, rales or rhonchi, normal respiratory effort. ?Cardiovascular system: normal S1/S2, RRR, no JVD, murmurs, rubs, gallops, no pedal edema.   ?Gastrointestinal system: soft, epigastric tenderness on  palpation. ?Central nervous system: A&O x3. no gross focal neurologic deficits, normal speech ?Extremities: moves all, no edema, normal tone ?Skin: dry, intact, normal temperature, face is flushed ?Psychiatry: normal mood, congruent affect, judgement and insight appear  normal ? ? ? ?Data Reviewed: ? ?Labs reviewed notable for sodium 134, glucose 237, calcium 8.8.  Lipid panel with cholesterol 221, HDL 36, LDL unable to be calculated, direct LDL 62.8, triglycerides 736.  White count improved from 14.0 >> 12.9 ? ?Family Communication: Significant other at bedside on rounds ? ?Disposition: ?Status is: Observation ?The patient remains OBS appropriate and will d/c before 2 midnights. ? Planned Discharge Destination: Home ? ? ? ?Time spent: 35 minutes ? ?Author: ?Pennie Banter, DO ?08/15/2021 1:55 PM ? ?For on call review [email or web address].  ?

## 2021-08-15 NOTE — Assessment & Plan Note (Addendum)
Home glimepiride and metformin are on hold. ?Basal insulin was resumed at half usual dose on admission. ?3/28: CBGs 250-301 ?-- Covered during admission with basal and short acting insulin ?-- Resumed on home regimen at time of discharge ?-- Encourage patient to arrange close follow-up given his elevated sugars.  He did not wish to change his regimen at this time. ?

## 2021-08-15 NOTE — Assessment & Plan Note (Addendum)
Complicated by episodes of recurrent acute pancreatitis.  Current triglycerides 236. ?-- Continue fenofibrate ?

## 2021-08-15 NOTE — Progress Notes (Signed)
Inpatient Diabetes Program Recommendations ? ?AACE/ADA: New Consensus Statement on Inpatient Glycemic Control (2015) ? ?Target Ranges:  Prepandial:   less than 140 mg/dL ?     Peak postprandial:   less than 180 mg/dL (1-2 hours) ?     Critically ill patients:  140 - 180 mg/dL  ? ?Lab Results  ?Component Value Date  ? GLUCAP 250 (H) 08/15/2021  ? HGBA1C 11.6 (H) 08/14/2021  ? ? ?Review of Glycemic Control ? Latest Reference Range & Units 12/18/16 06:34 12/18/16 11:38 04/16/19 01:00 08/14/21 20:55 08/15/21 08:30  ?Glucose-Capillary 70 - 99 mg/dL 539 (H) 767 (H) 341 (H) 225 (H) 250 (H)  ?(H): Data is abnormally high ? ?Diabetes history: DM2 ?Outpatient Diabetes medications: Tresiba 160 units qd, Amaryl 4 mg bid, Metformin 1 gm bid ?Current orders for Inpatient glycemic control: Semglee 7 units qd, Novolog 0-15 units tid + hs 0-5 units ? ?Inpatient Diabetes Program Recommendations:   ?Spoke with pt via phone (DM coordinator working remotely from [employer]. And discussed A1C of 11.6 (average blood glucose 286 over the past 2-3 months)  and explained what an A1C is, basic pathophysiology of DM Type 2, basic home care, basic diabetes diet nutrition principles, importance of checking CBGs and maintaining good CBG control to prevent long-term and short-term complications. Reviewed signs and symptoms of hyperglycemia and hypoglycemia and how to treat hypoglycemia at home. Also reviewed blood sugar goals at home.  ?Ordered educational booklet. ? ?Patient has been drinking significant amount of gatorade with sugar during his job. Patient works 5-6 9/ days=approximately 75 hrs per week. Patient shared he has been eating while working and eating bananas  ?Processed foods from cans. Reviewed basic plate method with pt. And discussed risks of elevated blood glucose. ?Patient sees Dr. Gershon Crane as endocrinologist but due to no insurance for a few months last office visit was 01/21/21. Patient states willingness to call and make appointment  as soon as possible. ? ?Please consider increase in Semglee to 24 units daily (0.2 units/kg x 122.1 kg = 24 units) ? ?Thank you, ?Billy Fischer Nubia Ziesmer, RN, MSN, CDE  ?Diabetes Coordinator ?Inpatient Glycemic Control Team ?Team Pager [phone] (8am-5pm) ?08/15/2021 12:00 PM ? ? ? ? ? ?

## 2021-08-15 NOTE — TOC Initial Note (Signed)
Transition of Care (TOC) - Initial/Assessment Note  ? ? ?Patient Details  ?Name: Dale Mendoza ?MRN: [id number] ?Date of Birth: 1981-04-14 ? ?Transition of Care (TOC) CM/SW Contact:    ?Beverly Sessions, RN ?Phone Number: ?08/15/2021, 8:44 AM ? ?Clinical Narrative:                 ? ? ?Transition of Care (TOC) Screening Note ? ? ?Patient Details  ?Name: Dale Mendoza ?Date of Birth: 05-09-1981 ? ? ?Transition of Care (TOC) CM/SW Contact:    ?Beverly Sessions, RN ?Phone Number: ?08/15/2021, 8:44 AM ? ? ? ?Transition of Care Department [facility]) has reviewed patient and no TOC needs have been identified at this time. We will continue to monitor patient advancement through interdisciplinary progression rounds. If new patient transition needs arise, please place a TOC consult. ?' ?  ?  ? ? ?Patient Goals and CMS Choice ?  ?  ?  ? ?Expected Discharge Plan and Services ?  ?  ?  ?  ?  ?                ?  ?  ?  ?  ?  ?  ?  ?  ?  ?  ? ?Prior Living Arrangements/Services ?  ?  ?  ?       ?  ?  ?  ?  ? ?Activities of Daily Living ?Home Assistive Devices/Equipment: None ?ADL Screening (condition at time of admission) ?Patient's cognitive ability adequate to safely complete daily activities?: Yes ?Is the patient deaf or have difficulty hearing?: No ?Does the patient have difficulty seeing, even when wearing glasses/contacts?: No ?Does the patient have difficulty concentrating, remembering, or making decisions?: No ?Patient able to express need for assistance with ADLs?: Yes ?Does the patient have difficulty dressing or bathing?: No ?Independently performs ADLs?: Yes (appropriate for developmental age) ?Does the patient have difficulty walking or climbing stairs?: No ?Weakness of Legs: None ?Weakness of Arms/Hands: None ? ?Permission Sought/Granted ?  ?  ?   ?   ?   ?   ? ?Emotional Assessment ?  ?  ?  ?  ?  ?  ? ?Admission diagnosis:  Pancreatitis, acute [K85.90] ?Periumbilical abdominal pain [R10.33] ?Other acute pancreatitis without  infection or necrosis [K85.80] ?Patient Active Problem List  ? Diagnosis Date Noted  ? Pancreatitis, acute 08/14/2021  ? Diabetes mellitus without complication ([place]) [id number]  ? GERD (gastroesophageal reflux disease) 08/14/2021  ? Hyperlipidemia 08/14/2021  ? Hypertension 08/14/2021  ? Recurrent pancreatitis 12/17/2016  ? ?PCP:  System, Provider Not In ?Pharmacy:   ?[place], [place] - [address] ?[address] ?[place] [address] ?Phone: [phone] Fax: [phone] ? ?[id number] [address] ?[address] ?[place] [address] ?Phone: [phone] Fax: [phone] ? ?[id number] - Cl[facility] - [place], [place] - [address] ?[address] ?[place] [address] ?Phone: [phone] Fax: [phone] ? ?[facility] #[id number] - [place], [place] - [address] ?[address] ?[place] [address] ?Phone: [phone] Fax: [phone] ? ? ? ? ?Social Determinants of Health (SDOH) Interventions ?  ? ?Readmission Risk Interventions ?   ? View : No data to display.  ?  ?  ?  ? ? ? ?

## 2021-08-15 NOTE — Assessment & Plan Note (Addendum)
Mildly improved since admission.  Tolerating diet but continues to have epigastric pain and nausea, hot spells with profuse sweating, remains tachycardic.   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  TGs 736 this admission, has previously had TGs over 1900. ? ?3/28: Feeling better, minimal abdominal pain nausea vomiting.  Tolerating diet.   ? ?Clinically improved and stable for discharge. ? ?

## 2021-08-15 NOTE — Assessment & Plan Note (Addendum)
Suspect reactive in the setting of acute pancreatitis.  Patient's not having fevers here but reported them at home and continues to have episodes of profuse sweats. ?--Check procalcitonin and lactate.   ?--Monitor CBC. ?--Defer antibiotics for now and monitor clinically ? ?WBC improving: 14.0 >> 12.9>> 11.7 ?

## 2021-08-15 NOTE — Assessment & Plan Note (Addendum)
Due to acute pancreatitis.  Treated with IV fluids and improved his symptoms were more improved. ?

## 2021-08-15 NOTE — Assessment & Plan Note (Addendum)
Due to acute pancreatitis.  No evidence of infection.  Presented with tachycardia and leukocytosis. ?Leukocytosis and tachycardia have both improved.  No fevers. ?

## 2021-08-15 NOTE — Hospital Course (Addendum)
41 y.o. 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   ? ?Evaluation in the ED revealed a normal lipase level, however CT of the abdomen did show stranding around the tail of the pancreas consistent with acute pancreatitis.  Labs otherwise notable for leukocytosis, mild hyponatremia. ? ?Admitted for acute pancreatitis. ? ? ? ?

## 2021-08-16 LAB — COMPREHENSIVE METABOLIC PANEL
ALT: 26 U/L (ref 0–44)
AST: 20 U/L (ref 15–41)
Albumin: 3.7 g/dL (ref 3.5–5.0)
Alkaline Phosphatase: 68 U/L (ref 38–126)
Anion gap: 11 (ref 5–15)
BUN: 11 mg/dL (ref 6–20)
CO2: 25 mmol/L (ref 22–32)
Calcium: 8.9 mg/dL (ref 8.9–10.3)
Chloride: 100 mmol/L (ref 98–111)
Creatinine, Ser: 0.57 mg/dL — ABNORMAL LOW (ref 0.61–1.24)
GFR, Estimated: >60 mL/min (ref >60)
Glucose, Bld: 247 mg/dL — ABNORMAL HIGH (ref 70–99)
Potassium: 3.7 mmol/L (ref 3.5–5.1)
Sodium: 136 mmol/L (ref 135–145)
Total Bilirubin: 1.2 mg/dL (ref 0.3–1.2)
Total Protein: 7 g/dL (ref 6.5–8.1)

## 2021-08-16 LAB — GLUCOSE, CAPILLARY: Glucose-Capillary: 250 mg/dL — ABNORMAL HIGH (ref 70–99)

## 2021-08-16 LAB — D-DIMER, QUANTITATIVE: D-Dimer, Quant: 0.69 ug/mL-FEU — ABNORMAL HIGH (ref 0.00–0.50)

## 2021-08-16 LAB — CBC
HCT: 40.6 % (ref 39.0–52.0)
Hemoglobin: 13.2 g/dL (ref 13.0–17.0)
MCH: 27.2 pg (ref 26.0–34.0)
MCHC: 32.5 g/dL (ref 30.0–36.0)
MCV: 83.5 fL (ref 80.0–100.0)
Platelets: 245 10*3/uL (ref 150–400)
RBC: 4.86 MIL/uL (ref 4.22–5.81)
RDW: 13.2 % (ref 11.5–15.5)
WBC: 11.7 10*3/uL — ABNORMAL HIGH (ref 4.0–10.5)
nRBC: 0 % (ref 0.0–0.2)

## 2021-08-16 MED ORDER — ONDANSETRON HCL 4 MG PO TABS: 4.0000 mg | ORAL_TABLET | Freq: Three times a day (TID) | ORAL | 0 refills | Status: DC | PRN

## 2021-08-16 MED ORDER — KETOROLAC TROMETHAMINE 15 MG/ML IJ SOLN
15.0000 mg | Freq: Once | INTRAMUSCULAR | Status: AC
  Administered 2021-08-16: 15 mg via INTRAVENOUS
  Filled 2021-08-16: qty 1

## 2021-08-16 MED ORDER — IBUPROFEN 400 MG PO TABS: 400.0000 mg | ORAL_TABLET | Freq: Four times a day (QID) | ORAL | Status: DC | PRN

## 2021-08-16 MED ORDER — MENTHOL 3 MG MT LOZG
1.0000 | LOZENGE | OROMUCOSAL | Status: DC | PRN
  Filled 2021-08-16: qty 9

## 2021-08-16 MED ORDER — IBUPROFEN 400 MG PO TABS: 400.0000 mg | ORAL_TABLET | Freq: Four times a day (QID) | ORAL | 0 refills | Status: AC | PRN

## 2021-08-16 MED ORDER — INSULIN GLARGINE-YFGN 100 UNIT/ML ~~LOC~~ SOLN: 24.0000 [IU] | Freq: Every day | SUBCUTANEOUS | Status: DC

## 2021-08-16 MED ORDER — PHENOL 1.4 % MT LIQD
1.0000 | OROMUCOSAL | Status: DC | PRN
  Filled 2021-08-16: qty 177

## 2021-08-16 NOTE — Discharge Summary (Signed)
?Physician Discharge Summary ?  ?Patient: Dale Mendoza MRN: [id number] DOB: July 22, 1980  ?Admit date:     08/14/2021  ?Discharge date: 08/16/21  ?Discharge Physician: Ezekiel Slocumb  ? ?PCP: System, Provider Not In  ? ?Recommendations at discharge:  ? ?Follow-up with primary care in 1 to 2 weeks ?Repeat CMP, CBC, MD in 1 to 2 weeks ?Follow-up on glycemic control and diabetes regimen ? ?Discharge Diagnoses: ?Principal Problem: ?  Acute recurrent pancreatitis ?Active Problems: ?  Diabetes mellitus without complication ([place]) ?  GERD (gastroesophageal reflux disease) ?  Hyperlipidemia ?  Hypertension ?  Familial hypertriglyceridemia ?  Tachycardia ?  SIRS (systemic inflammatory response syndrome) (HCC) ?  Leukocytosis ? ? ?Hospital Course: ?41 y.o. 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   ? ?Evaluation in the ED revealed a normal lipase level, however CT of the abdomen did show stranding around the tail of the pancreas consistent with acute pancreatitis.  Labs otherwise notable for leukocytosis, mild hyponatremia. ? ?Admitted for acute pancreatitis. ? ? ? ? ?Assessment and Plan: ?* Acute recurrent pancreatitis ?Mildly improved since admission.  Tolerating diet but continues to have epigastric pain and nausea, hot spells with profuse sweating, remains tachycardic.   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  TGs 736 this admission, has previously had TGs over 1900. ? ?3/28: Feeling better, minimal abdominal pain nausea vomiting.  Tolerating diet.   ? ?Clinically improved and stable for discharge. ? ? ?Leukocytosis ?Suspect reactive in the setting of acute pancreatitis.  Patient's not having fevers here but reported them at home and  continues to have episodes of profuse sweats. ?--Check procalcitonin and lactate.   ?--Monitor CBC. ?--Defer antibiotics for now and monitor clinically ? ?WBC improving: 14.0 >> 12.9>> 11.7 ? ?SIRS (systemic inflammatory response syndrome) (HCC) ?Due to acute pancreatitis.  No evidence of infection.  Presented with tachycardia and leukocytosis. ?Leukocytosis and tachycardia have both improved.  No fevers. ? ?Tachycardia ?Due to acute pancreatitis.  Treated with IV fluids and improved his symptoms were more improved. ? ?Familial hypertriglyceridemia ?Complicated by episodes of recurrent acute pancreatitis.  Current triglycerides 236. ?-- Continue fenofibrate ? ?Hypertension ?Continue home lisinopril ? ?Hyperlipidemia ?Continue Lipitor ? ?Diabetes mellitus without complication ([place]) ?Home glimepiride and metformin are on hold. ?Basal insulin was resumed at half usual dose on admission. ?3/28: CBGs 250-301 ?-- Covered during admission with basal and short acting insulin ?-- Resumed on home regimen at time of discharge ?-- Encourage patient to arrange close follow-up given his elevated sugars.  He did not wish to change his regimen at this time. ? ? ? ? ?  ? ? ?Consultants: None ?Procedures performed: None ?Disposition: Home ?Diet recommendation:  ?Discharge Diet Orders (From admission, onward)  ? ?  Start     Ordered  ? 08/16/21 0000  Diet - low sodium heart healthy       ? 08/16/21 1044  ? ?  ?  ? ?  ? ?Cardiac and Carb modified diet ?DISCHARGE MEDICATION: ?Allergies as of 08/16/2021   ?No Known Allergies ?  ? ?  ?Medication List  ?  ? ?STOP taking these medications   ? ?Jardiance 25 MG Tabs tablet ?Generic drug: empagliflozin ?  ?oxyCODONE-acetaminophen 5-325 MG tablet ?Commonly known as:  Percocet ?  ? ?  ? ?TAKE these medications   ? ?albuterol 108 (90 Base) MCG/ACT inhaler ?Commonly known as: VENTOLIN HFA ?Inhale 1-2 puffs into the lungs every 6 (six) hours as needed for wheezing or shortness of breath. ?   ?atorvastatin 40 MG tablet ?Commonly known as: LIPITOR ?TAKE 1 TABLET BY MOUTH DAILY ?  ?Cetirizine HCl 10 MG Caps ?Take 1 tablet by mouth daily. ?  ?fenofibrate 145 MG tablet ?Commonly known as: TRICOR ?Take 1 tablet (145 mg total) by mouth daily. Will need fasting labs in july ?  ?Fish Oil 1200 MG Caps ?Take by mouth 2 (two) times daily. ?  ?fluticasone 50 MCG/ACT nasal spray ?Commonly known as: FLONASE ?Place 1 spray into both nostrils 2 (two) times daily. ?  ?glimepiride 4 MG tablet ?Commonly known as: AMARYL ?Take 4 mg by mouth daily. ?  ?ibuprofen 400 MG tablet ?Commonly known as: ADVIL ?Take 1 tablet (400 mg total) by mouth every 6 (six) hours as needed for mild pain or headache. ?  ?insulin glargine 100 unit/mL Sopn ?Commonly known as: LANTUS ?Inject 0.15 mLs (15 Units total) into the skin at bedtime. ?  ?Insulin Pen Needle 30G X 8 MM Misc ?Commonly known as: NOVOFINE ?Inject 10 each into the skin as needed. ?  ?lisinopril 20 MG tablet ?Commonly known as: ZESTRIL ?Take 20 mg by mouth 2 (two) times daily. ?  ?loratadine 10 MG tablet ?Commonly known as: CLARITIN ?Take 1 tablet (10 mg total) by mouth daily. ?  ?metformin 1000 MG (OSM) 24 hr tablet ?Commonly known as: FORTAMET ?Take 1 tablet (1,000 mg total) by mouth 2 (two) times daily with a meal. ?  ?ondansetron 4 MG tablet ?Commonly known as: Zofran ?Take 1 tablet (4 mg total) by mouth every 8 (eight) hours as needed for nausea or vomiting. ?  ?ONE TOUCH ULTRA TEST test strip ?Generic drug: glucose blood ?USE AS DIRECTED THREE TIMES A DAY ?  ?pantoprazole 40 MG tablet ?Commonly known as: PROTONIX ?Take 40 mg by mouth 2 (two) times daily. ?  ?Tyler Aas FlexTouch 200 UNIT/ML FlexTouch Pen ?Generic drug: insulin degludec ?SMARTSIG:160 Unit(s) SUB-Q Daily ?  ? ?  ? ? ?Discharge Exam: ?Filed Weights  ? 08/14/21 1306 08/14/21 2011  ?Weight: 122.5 kg 122.1 kg  ? ?General exam: awake, alert, no acute distress ?HEENT: atraumatic, clear conjunctiva, anicteric sclera,  moist mucus membranes, hearing grossly normal  ?Respiratory system: normal respiratory effort.  On room air ?Cardiovascular system: normal S1/S2, RRR, no pedal edema.   ?Gastrointestinal system: soft, NT, ND, no HSM felt, +bowel sounds. ?Central nervous system: A&O x4. no gross focal neurologic deficits, normal speech ?Extremities: moves all, no edema, normal tone ?Skin: dry, intact, normal temperature, normal color, No rashes, lesions or ulcers ?Psychiatry: normal mood, congruent affect, judgement and insight appear normal ? ? ?Condition at discharge: stable ? ?The results of significant diagnostics from this hospitalization (including imaging, microbiology, ancillary and laboratory) are listed below for reference.  ? ?Imaging Studies: ?CT ABDOMEN PELVIS W CONTRAST ? ?Result Date: 08/14/2021 ?CLINICAL DATA:  Central abdominal pain. Vomiting. Subjective fever. Leukocytosis. EXAM: CT ABDOMEN AND PELVIS WITH CONTRAST TECHNIQUE: Multidetector CT imaging of the abdomen and pelvis was performed using the standard protocol following bolus administration of intravenous contrast. RADIATION DOSE REDUCTION: This exam was performed according to the departmental dose-optimization program which includes automated exposure control, adjustment of the mA and/or kV according to patient size and/or use of iterative reconstruction technique. CONTRAST:  83mL OMNIPAQUE  IOHEXOL 300 MG/ML  SOLN COMPARISON:  04/15/2019. FINDINGS: Lower chest: Clear lung bases. Hepatobiliary: Liver mildly enlarged, 30 cm transversely, with diffuse decreased attenuation consistent with fatty infiltration. No liver mass or focal lesion. Normal gallbladder. No bile duct dilation. Pancreas: Subtle hazy and reticular opacity noted adjacent to the pancreatic tail. Pancreas otherwise unremarkable. Homogeneous pancreatic enhancement. No mass or duct dilation. Spleen: Normal in size without focal abnormality. Adrenals/Urinary Tract: No adrenal masses. Kidneys normal  size, orientation and position. 2 cm left upper pole renal cyst. No other renal masses, no stones and no hydronephrosis. Normal ureters. Normal bladder. Stomach/Bowel: Stomach is within normal limits. Appendix app

## 2022-01-10 ENCOUNTER — Encounter: Payer: Self-pay | Admitting: Emergency Medicine

## 2022-01-10 ENCOUNTER — Observation Stay
Admission: EM | Admit: 2022-01-10 | Discharge: 2022-01-12 | DRG: 440 | Disposition: A | Discharge status: Home / Self Care | Payer: BC Managed Care – PPO | Attending: Internal Medicine | Admitting: Internal Medicine

## 2022-01-10 ENCOUNTER — Other Ambulatory Visit: Payer: Self-pay

## 2022-01-10 DIAGNOSIS — E669 Obesity, unspecified: Secondary | ICD-10-CM | POA: Diagnosis not present

## 2022-01-10 DIAGNOSIS — Z794 Long term (current) use of insulin: Secondary | ICD-10-CM | POA: Diagnosis not present

## 2022-01-10 DIAGNOSIS — Z91119 Patient's noncompliance with dietary regimen due to unspecified reason: Secondary | ICD-10-CM | POA: Diagnosis not present

## 2022-01-10 DIAGNOSIS — Z79899 Other long term (current) drug therapy: Secondary | ICD-10-CM | POA: Diagnosis not present

## 2022-01-10 DIAGNOSIS — I10 Essential (primary) hypertension: Secondary | ICD-10-CM | POA: Diagnosis present

## 2022-01-10 DIAGNOSIS — E7800 Pure hypercholesterolemia, unspecified: Secondary | ICD-10-CM | POA: Diagnosis not present

## 2022-01-10 DIAGNOSIS — Z83438 Family history of other disorder of lipoprotein metabolism and other lipidemia: Secondary | ICD-10-CM

## 2022-01-10 DIAGNOSIS — K861 Other chronic pancreatitis: Secondary | ICD-10-CM | POA: Diagnosis present

## 2022-01-10 DIAGNOSIS — K859 Acute pancreatitis without necrosis or infection, unspecified: Principal | ICD-10-CM | POA: Diagnosis present

## 2022-01-10 DIAGNOSIS — Z6837 Body mass index (BMI) 37.0-37.9, adult: Secondary | ICD-10-CM | POA: Diagnosis not present

## 2022-01-10 DIAGNOSIS — K219 Gastro-esophageal reflux disease without esophagitis: Secondary | ICD-10-CM | POA: Diagnosis not present

## 2022-01-10 DIAGNOSIS — K858 Other acute pancreatitis without necrosis or infection: Secondary | ICD-10-CM | POA: Diagnosis not present

## 2022-01-10 DIAGNOSIS — E1165 Type 2 diabetes mellitus with hyperglycemia: Secondary | ICD-10-CM | POA: Diagnosis present

## 2022-01-10 DIAGNOSIS — Z87891 Personal history of nicotine dependence: Secondary | ICD-10-CM | POA: Diagnosis not present

## 2022-01-10 DIAGNOSIS — E782 Mixed hyperlipidemia: Secondary | ICD-10-CM

## 2022-01-10 DIAGNOSIS — E781 Pure hyperglyceridemia: Secondary | ICD-10-CM | POA: Diagnosis present

## 2022-01-10 LAB — BASIC METABOLIC PANEL
Anion gap: 8 (ref 5–15)
Anion gap: 9 (ref 5–15)
BUN: 12 mg/dL (ref 6–20)
BUN: 13 mg/dL (ref 6–20)
CO2: 25 mmol/L (ref 22–32)
CO2: 25 mmol/L (ref 22–32)
Calcium: 8.6 mg/dL — ABNORMAL LOW (ref 8.9–10.3)
Calcium: 8.7 mg/dL — ABNORMAL LOW (ref 8.9–10.3)
Chloride: 107 mmol/L (ref 98–111)
Chloride: 107 mmol/L (ref 98–111)
Creatinine, Ser: 0.73 mg/dL (ref 0.61–1.24)
Creatinine, Ser: 0.65 mg/dL (ref 0.61–1.24)
GFR, Estimated: >60 mL/min (ref >60)
GFR, Estimated: >60 mL/min (ref >60)
Glucose, Bld: 187 mg/dL — ABNORMAL HIGH (ref 70–99)
Glucose, Bld: 164 mg/dL — ABNORMAL HIGH (ref 70–99)
Potassium: 3.7 mmol/L (ref 3.5–5.1)
Potassium: 3.3 mmol/L — ABNORMAL LOW (ref 3.5–5.1)
Sodium: 140 mmol/L (ref 135–145)
Sodium: 141 mmol/L (ref 135–145)

## 2022-01-10 LAB — CBC
HCT: 43.5 % (ref 39.0–52.0)
Hemoglobin: 14.1 g/dL (ref 13.0–17.0)
MCH: 27 pg (ref 26.0–34.0)
MCHC: 32.4 g/dL (ref 30.0–36.0)
MCV: 83.3 fL (ref 80.0–100.0)
Platelets: 274 10*3/uL (ref 150–400)
RBC: 5.22 MIL/uL (ref 4.22–5.81)
RDW: 13.4 % (ref 11.5–15.5)
WBC: 6.7 10*3/uL (ref 4.0–10.5)
nRBC: 0 % (ref 0.0–0.2)

## 2022-01-10 LAB — PROTIME-INR
INR: 1 (ref 0.8–1.2)
Prothrombin Time: 12.6 seconds (ref 11.4–15.2)

## 2022-01-10 LAB — URINALYSIS, ROUTINE W REFLEX MICROSCOPIC
Bacteria, UA: NONE SEEN
Bilirubin Urine: NEGATIVE
Glucose, UA: >=500 mg/dL — AB
Hgb urine dipstick: NEGATIVE
Ketones, ur: NEGATIVE mg/dL
Leukocytes,Ua: NEGATIVE
Nitrite: NEGATIVE
Protein, ur: 30 mg/dL — AB
Specific Gravity, Urine: 1.029 (ref 1.005–1.030)
Squamous Epithelial / HPF: NONE SEEN (ref 0–5)
pH: 7 (ref 5.0–8.0)

## 2022-01-10 LAB — LIPASE, BLOOD: Lipase: 42 U/L (ref 11–51)

## 2022-01-10 LAB — GLUCOSE, CAPILLARY
Glucose-Capillary: 186 mg/dL — ABNORMAL HIGH (ref 70–99)
Glucose-Capillary: 176 mg/dL — ABNORMAL HIGH (ref 70–99)
Glucose-Capillary: 153 mg/dL — ABNORMAL HIGH (ref 70–99)
Glucose-Capillary: 132 mg/dL — ABNORMAL HIGH (ref 70–99)

## 2022-01-10 LAB — COMPREHENSIVE METABOLIC PANEL
ALT: 47 U/L — ABNORMAL HIGH (ref 0–44)
AST: 40 U/L (ref 15–41)
Albumin: 4.1 g/dL (ref 3.5–5.0)
Alkaline Phosphatase: 114 U/L (ref 38–126)
Anion gap: 11 (ref 5–15)
BUN: 11 mg/dL (ref 6–20)
CO2: 26 mmol/L (ref 22–32)
Calcium: 9.5 mg/dL (ref 8.9–10.3)
Chloride: 100 mmol/L (ref 98–111)
Creatinine, Ser: 0.75 mg/dL (ref 0.61–1.24)
GFR, Estimated: >60 mL/min (ref >60)
Glucose, Bld: 332 mg/dL — ABNORMAL HIGH (ref 70–99)
Potassium: 4 mmol/L (ref 3.5–5.1)
Sodium: 137 mmol/L (ref 135–145)
Total Bilirubin: 0.7 mg/dL (ref 0.3–1.2)
Total Protein: 7.2 g/dL (ref 6.5–8.1)

## 2022-01-10 LAB — APTT: aPTT: 24 seconds (ref 24–36)

## 2022-01-10 LAB — TROPONIN I (HIGH SENSITIVITY): Troponin I (High Sensitivity): 6 ng/L (ref <18)

## 2022-01-10 LAB — CBG MONITORING, ED: Glucose-Capillary: 332 mg/dL — ABNORMAL HIGH (ref 70–99)

## 2022-01-10 LAB — TRIGLYCERIDES: Triglycerides: 976 mg/dL — ABNORMAL HIGH (ref <150)

## 2022-01-10 MED ORDER — DEXTROSE IN LACTATED RINGERS 5 % IV SOLN: INTRAVENOUS | Status: DC

## 2022-01-10 MED ORDER — POTASSIUM CHLORIDE 10 MEQ/100ML IV SOLN
10.0000 meq | INTRAVENOUS | Status: AC
  Administered 2022-01-10: 10 meq via INTRAVENOUS
  Filled 2022-01-10 (×2): qty 100

## 2022-01-10 MED ORDER — ALBUTEROL SULFATE (2.5 MG/3ML) 0.083% IN NEBU: 3.0000 mL | INHALATION_SOLUTION | Freq: Four times a day (QID) | RESPIRATORY_TRACT | Status: DC | PRN

## 2022-01-10 MED ORDER — HEPARIN (PORCINE) 25000 UT/250ML-% IV SOLN
1100.0000 [IU]/h | INTRAVENOUS | Status: DC
  Administered 2022-01-10: 1000 [IU]/h via INTRAVENOUS
  Filled 2022-01-10: qty 250

## 2022-01-10 MED ORDER — MAGNESIUM HYDROXIDE 400 MG/5ML PO SUSP
30.0000 mL | Freq: Every day | ORAL | Status: DC | PRN
Start: 2022-01-10 — End: 2022-01-12

## 2022-01-10 MED ORDER — ONDANSETRON HCL 4 MG/2ML IJ SOLN
4.0000 mg | Freq: Once | INTRAMUSCULAR | Status: AC
  Administered 2022-01-10: 4 mg via INTRAVENOUS
  Filled 2022-01-10: qty 2

## 2022-01-10 MED ORDER — FENOFIBRATE 160 MG PO TABS
160.0000 mg | ORAL_TABLET | Freq: Every day | ORAL | Status: DC
  Administered 2022-01-11 – 2022-01-12 (×2): 160 mg via ORAL
  Filled 2022-01-10 (×2): qty 1

## 2022-01-10 MED ORDER — ATORVASTATIN CALCIUM 20 MG PO TABS
40.0000 mg | ORAL_TABLET | Freq: Every day | ORAL | Status: DC
  Administered 2022-01-10 – 2022-01-11 (×2): 40 mg via ORAL
  Filled 2022-01-10 (×3): qty 2

## 2022-01-10 MED ORDER — INSULIN REGULAR(HUMAN) IN NACL 100-0.9 UT/100ML-% IV SOLN: INTRAVENOUS | Status: DC

## 2022-01-10 MED ORDER — ACETAMINOPHEN 650 MG RE SUPP: 650.0000 mg | Freq: Four times a day (QID) | RECTAL | Status: DC | PRN

## 2022-01-10 MED ORDER — LACTATED RINGERS IV SOLN: INTRAVENOUS | Status: DC

## 2022-01-10 MED ORDER — ACETAMINOPHEN 500 MG PO TABS
1000.0000 mg | ORAL_TABLET | Freq: Once | ORAL | Status: AC
  Administered 2022-01-10: 1000 mg via ORAL
  Filled 2022-01-10: qty 2

## 2022-01-10 MED ORDER — OXYCODONE HCL 5 MG PO TABS
5.0000 mg | ORAL_TABLET | Freq: Once | ORAL | Status: AC
  Administered 2022-01-10: 5 mg via ORAL
  Filled 2022-01-10: qty 1

## 2022-01-10 MED ORDER — ONDANSETRON HCL 4 MG PO TABS: 4.0000 mg | ORAL_TABLET | Freq: Four times a day (QID) | ORAL | Status: DC | PRN

## 2022-01-10 MED ORDER — TRAZODONE HCL 50 MG PO TABS
25.0000 mg | ORAL_TABLET | Freq: Every evening | ORAL | Status: DC | PRN
  Administered 2022-01-11: 25 mg via ORAL
  Filled 2022-01-10: qty 1

## 2022-01-10 MED ORDER — ONDANSETRON HCL 4 MG PO TABS: 4.0000 mg | ORAL_TABLET | Freq: Three times a day (TID) | ORAL | Status: DC | PRN

## 2022-01-10 MED ORDER — LISINOPRIL 20 MG PO TABS
20.0000 mg | ORAL_TABLET | Freq: Two times a day (BID) | ORAL | Status: DC
  Administered 2022-01-10 – 2022-01-12 (×4): 20 mg via ORAL
  Filled 2022-01-10 (×4): qty 1

## 2022-01-10 MED ORDER — ENOXAPARIN SODIUM 40 MG/0.4ML IJ SOSY: 40.0000 mg | PREFILLED_SYRINGE | INTRAMUSCULAR | Status: DC

## 2022-01-10 MED ORDER — PANTOPRAZOLE SODIUM 40 MG PO TBEC
40.0000 mg | DELAYED_RELEASE_TABLET | Freq: Two times a day (BID) | ORAL | Status: DC
  Administered 2022-01-10 – 2022-01-12 (×4): 40 mg via ORAL
  Filled 2022-01-10 (×4): qty 1

## 2022-01-10 MED ORDER — LORATADINE 10 MG PO TABS: 10.0000 mg | ORAL_TABLET | Freq: Every day | ORAL | Status: DC | PRN

## 2022-01-10 MED ORDER — FLUTICASONE PROPIONATE 50 MCG/ACT NA SUSP: 1.0000 | Freq: Two times a day (BID) | NASAL | Status: DC | PRN

## 2022-01-10 MED ORDER — DEXTROSE 50 % IV SOLN: 0.0000 mL | INTRAVENOUS | Status: DC | PRN

## 2022-01-10 MED ORDER — SODIUM CHLORIDE 0.9 % IV BOLUS
1000.0000 mL | Freq: Once | INTRAVENOUS | Status: AC
  Administered 2022-01-10: 1000 mL via INTRAVENOUS

## 2022-01-10 MED ORDER — LACTATED RINGERS IV BOLUS
1000.0000 mL | Freq: Once | INTRAVENOUS | Status: AC
  Administered 2022-01-10: 1000 mL via INTRAVENOUS

## 2022-01-10 MED ORDER — ONDANSETRON HCL 4 MG/2ML IJ SOLN: 4.0000 mg | Freq: Four times a day (QID) | INTRAMUSCULAR | Status: DC | PRN

## 2022-01-10 MED ORDER — POTASSIUM CHLORIDE 10 MEQ/100ML IV SOLN
10.0000 meq | INTRAVENOUS | Status: AC
  Administered 2022-01-10 – 2022-01-11 (×2): 10 meq via INTRAVENOUS
  Filled 2022-01-10 (×2): qty 100

## 2022-01-10 MED ORDER — KETOROLAC TROMETHAMINE 15 MG/ML IJ SOLN
15.0000 mg | Freq: Once | INTRAMUSCULAR | Status: AC
  Administered 2022-01-10: 15 mg via INTRAVENOUS
  Filled 2022-01-10: qty 1

## 2022-01-10 MED ORDER — DEXTROSE 5 % IV SOLN: INTRAVENOUS | Status: DC

## 2022-01-10 MED ORDER — ACETAMINOPHEN 325 MG PO TABS
650.0000 mg | ORAL_TABLET | Freq: Four times a day (QID) | ORAL | Status: DC | PRN
  Administered 2022-01-11: 650 mg via ORAL
  Filled 2022-01-10: qty 2

## 2022-01-10 MED ORDER — INSULIN (MYXREDLIN) INFUSION FOR HYPERTRIGLYCERIDEMIA
0.0500 [IU]/kg/h | INTRAVENOUS | Status: DC
  Administered 2022-01-10: 0.1 [IU]/kg/h via INTRAVENOUS
  Administered 2022-01-11 (×2): 0.05 [IU]/kg/h via INTRAVENOUS
  Filled 2022-01-10 (×3): qty 100

## 2022-01-10 NOTE — H&P (Addendum)
[place]   PATIENT NAME: Dale Mendoza    MR#:  [id number]  DATE OF BIRTH:  1981/04/21  DATE OF ADMISSION:  01/10/2022  PRIMARY CARE PHYSICIAN: Shane Crutch, PA   Patient is coming from: Home  REQUESTING/REFERRING PHYSICIAN: Artis Delay, MD  CHIEF COMPLAINT:   Chief Complaint  Patient presents with   Abdominal Pain    HISTORY OF PRESENT ILLNESS:  Dale Mendoza is a 41 y.o. Caucasian male with medical history significant for type 2 diabetes mellitus, GERD, hypertension, dyslipidemia with hypertriglyceridemia and history of recurrent pancreatitis, who presented to the emergency room with acute onset of upper abdominal pain with no nausea or vomiting.  He denies any fever or chills.  No chest pain or dyspnea or cough or wheezing.  No recent alcohol intake.  No dysuria, oliguria or hematuria or flank pain.  No bleeding diathesis.  ED Course: When he came to the ER vital signs were within normal.  Labs revealed hyperglycemia of 332 with ALT 47 otherwise unremarkable CMP.  CBC was within normal.  Triglycerides 976 and high sensitive troponin was 6.  Lipase was 42.  UA showed more than 500 glucose and 30 protein. EKG as reviewed by me : EKG showed normal sinus rhythm with a rate of 92 with poor R wave progression.. Imaging: None.  The patient will was given 1 g of p.o. Tylenol, 15 mg of IV Toradol, 1 L bolus lactated Ringer and 1 L of IV normal saline, 4 mg of IV Zofran and 5 mg of p.o. oxycodone.  He was started on IV insulin drip per hypertriglyceridemia protocol.  I also started him on IV heparin drip.  He will be admitted to a stepdown unit bed for further evaluation and management. PAST MEDICAL HISTORY:   Past Medical History:  Diagnosis Date   Allergy    seasonal   Diabetes mellitus without complication (HCC)    GERD (gastroesophageal reflux disease)    Hyperlipidemia    Hypertension    Pancreatitis     PAST SURGICAL HISTORY:   Past Surgical History:  Procedure  Laterality Date   HERNIA REPAIR     TONSILLECTOMY      SOCIAL HISTORY:   Social History   Tobacco Use   Smoking status: Former    Types: Cigarettes    Quit date: 05/22/2013    Years since quitting: 8.6   Smokeless tobacco: Current    Types: Chew   Tobacco comments:    Occasional about a pack a week  Substance Use Topics   Alcohol use: Not Currently    FAMILY HISTORY:   Family History  Problem Relation Age of Onset   Hyperlipidemia Father    Colon cancer Father    Pancreatic cancer Paternal Grandfather     DRUG ALLERGIES:  No Known Allergies  REVIEW OF SYSTEMS:   ROS As per history of present illness. All pertinent systems were reviewed above. Constitutional, HEENT, cardiovascular, respiratory, GI, GU, musculoskeletal, neuro, psychiatric, endocrine, integumentary and hematologic systems were reviewed and are otherwise negative/unremarkable except for positive findings mentioned above in the HPI.   MEDICATIONS AT HOME:   Prior to Admission medications   Medication Sig Start Date End Date Taking? Authorizing Provider  atorvastatin (LIPITOR) 40 MG tablet TAKE 1 TABLET BY MOUTH DAILY 02/09/17  Yes Fisher, Roselyn Bering, PA-C  fenofibrate (TRICOR) 145 MG tablet Take 1 tablet (145 mg total) by mouth daily. Will need fasting labs in july 12/06/16  Yes Sherrie Mustache Roselyn Bering, PA-C  fexofenadine (ALLEGRA) 60 MG tablet Take 60 mg by mouth daily.   Yes [provider]  glimepiride (AMARYL) 4 MG tablet Take 4 mg by mouth daily. 06/12/17 01/10/22 Yes [provider]  lisinopril (ZESTRIL) 20 MG tablet Take 20 mg by mouth 2 (two) times daily. 02/21/21  Yes [provider]  metFORMIN (GLUCOPHAGE-XR) 500 MG 24 hr tablet Take 1,000 mg by mouth 2 (two) times daily. 10/18/21  Yes [provider]  Multiple Vitamins-Minerals (ALIVE DIABETIC MULTIVITAMIN PO) Take 1 tablet by mouth daily.   Yes [provider]  Omega-3 Fatty Acids (FISH OIL) 1200 MG CAPS Take by  mouth 2 (two) times daily.   Yes [provider]  pantoprazole (PROTONIX) 40 MG tablet Take 40 mg by mouth 2 (two) times daily. 07/03/21  Yes [provider]  TRESIBA FLEXTOUCH 200 UNIT/ML FlexTouch Pen Inject 160 Units into the skin at bedtime. 07/03/21  Yes [provider]  albuterol (PROVENTIL HFA;VENTOLIN HFA) 108 (90 Base) MCG/ACT inhaler Inhale 1-2 puffs into the lungs every 6 (six) hours as needed for wheezing or shortness of breath. 10/04/15   Fisher, Roselyn Bering, PA-C  Cetirizine HCl 10 MG CAPS Take 1 tablet by mouth daily.    [provider]  fluticasone (FLONASE) 50 MCG/ACT nasal spray Place 1 spray into both nostrils 2 (two) times daily. 10/04/15   Fisher, Roselyn Bering, PA-C  ibuprofen (ADVIL) 400 MG tablet Take 1 tablet (400 mg total) by mouth every 6 (six) hours as needed for mild pain or headache. 08/16/21   Esaw Grandchild A, DO  insulin glargine (LANTUS) 100 unit/mL SOPN Inject 0.15 mLs (15 Units total) into the skin at bedtime. Patient not taking: Reported on 08/14/2021 12/18/16   Houston Siren, MD  Insulin Pen Needle (NOVOFINE) 30G X 8 MM MISC Inject 10 each into the skin as needed. 12/18/16   Houston Siren, MD  loratadine (CLARITIN) 10 MG tablet Take 1 tablet (10 mg total) by mouth daily. Patient not taking: Reported on 01/10/2022 07/21/15   Barbaraann Barthel, NP  metformin (FORTAMET) 1000 MG (OSM) 24 hr tablet Take 1 tablet (1,000 mg total) by mouth 2 (two) times daily with a meal. Patient not taking: Reported on 01/10/2022 12/18/16   Houston Siren, MD  ondansetron (ZOFRAN) 4 MG tablet Take 1 tablet (4 mg total) by mouth every 8 (eight) hours as needed for nausea or vomiting. 08/16/21   Esaw Grandchild A, DO  ONE TOUCH ULTRA TEST test strip USE AS DIRECTED THREE TIMES A DAY 12/29/16   Sherrie Mustache Roselyn Bering, PA-C      VITAL SIGNS:  Blood pressure (!) 124/51, pulse 91, temperature 98.5 F (36.9 C), temperature source Oral, resp. rate 16, height 6' (1.829 m),  weight 127 kg, SpO2 95 %.  PHYSICAL EXAMINATION:  Physical Exam  GENERAL:  41 y.o.-year-old Caucasian male patient lying in the bed with no acute distress.  EYES: Pupils equal, round, reactive to light and accommodation. No scleral icterus. Extraocular muscles intact.  HEENT: Head atraumatic, normocephalic. Oropharynx and nasopharynx clear.  NECK:  Supple, no jugular venous distention. No thyroid enlargement, no tenderness.  LUNGS: Normal breath sounds bilaterally, no wheezing, rales,rhonchi or crepitation. No use of accessory muscles of respiration.  CARDIOVASCULAR: Regular rate and rhythm, S1, S2 normal. No murmurs, rubs, or gallops.  ABDOMEN: Soft, nondistended, with epigastric and left upper quadrant tenderness without rebound tenderness guarding or rigidity.. Bowel sounds present. No  organomegaly or mass.  EXTREMITIES: No pedal edema, cyanosis, or clubbing.  NEUROLOGIC: Cranial nerves II through XII are intact. Muscle strength 5/5 in all extremities. Sensation intact. Gait not checked.  PSYCHIATRIC: The patient is alert and oriented x 3.  Normal affect and good eye contact. SKIN: No obvious rash, lesion, or ulcer.   LABORATORY PANEL:   CBC Recent Labs  Lab 01/10/22 1433  WBC 6.7  HGB 14.1  HCT 43.5  PLT 274   ------------------------------------------------------------------------------------------------------------------  Chemistries  Recent Labs  Lab 01/10/22 1433  NA 137  K 4.0  CL 100  CO2 26  GLUCOSE 332*  BUN 11  CREATININE 0.75  CALCIUM 9.5  AST 40  ALT 47*  ALKPHOS 114  BILITOT 0.7   ------------------------------------------------------------------------------------------------------------------  Cardiac Enzymes No results for input(s): "TROPONINI" in the last 168 hours. ------------------------------------------------------------------------------------------------------------------  RADIOLOGY:  No results found.    IMPRESSION AND PLAN:   Assessment and Plan: * Acute pancreatitis - This is clinically suspected based on his severe hypertriglyceridemia and history of recurrent pancreatitis due to such hypertriglyceridemia. - He will be kept NPO. - We will follow triglycerides level with management as below. - Pain management will be provided. - The patient's lipase was within normal with his previous bout of acute pancreatitis in March. - We will consider abdominal CT only if he has worsening symptoms to assess for complications, otherwise at this time I do not believe it will change her management..  Familial hypertriglyceridemia - This is clearly the culprit for #1. - We will continue his fenofibrate therapy as well as statin therapy. - We will continue him on IV insulin drip per hypertriglyceridemia protocol. - We will also add IV heparin drip. - Triglycerides level will be checked daily.  Uncontrolled type 2 diabetes mellitus with hyperglycemia, with long-term current use of insulin (HCC) - This should be covered with IV insulin drip. - While he is on insulin drip I am holding off his Lantus and Amaryl. - We will hold off metformin.  Essential hypertension Use.- We will continue his antihypertensives.  GERD without esophagitis - We will continue PPI therapy.    DVT prophylaxis: Lovenox.  Advanced Care Planning:  Code Status: full code.  Family Communication:  The plan of care was discussed in details with the patient (and family). I answered all questions. The patient agreed to proceed with the above mentioned plan. Further management will depend upon hospital course. Disposition Plan: Back to previous home environment Consults called: none.  All the records are reviewed and case discussed with ED provider.  Status is: Inpatient    At the time of the admission, it appears that the appropriate admission status for this patient is inpatient.  This is judged to be reasonable and necessary in order to provide  the required intensity of service to ensure the patient's safety given the presenting symptoms, physical exam findings and initial radiographic and laboratory data in the context of comorbid conditions.  The patient requires inpatient status due to high intensity of service, high risk of further deterioration and high frequency of surveillance required.  I certify that at the time of admission, it is my clinical judgment that the patient will require inpatient hospital care extending more than 2 midnights.                            Dispo: The patient is from: Home  Anticipated d/c is to: Home              Patient currently is not medically stable to d/c.              Difficult to place patient: No  Hannah Beat M.D on 01/10/2022 at 8:13 PM  [facility]   From 7 PM-7 AM, contact night-coverage [email or web address]  CC: Primary care physician; Shane Crutch, PA

## 2022-01-10 NOTE — Assessment & Plan Note (Signed)
-   This is clearly the culprit for #1. - We will continue his fenofibrate therapy as well as statin therapy. - We will continue him on IV insulin drip per hypertriglyceridemia protocol. - We will also add IV heparin drip. - Triglycerides level will be checked daily.

## 2022-01-10 NOTE — Consult Note (Addendum)
ANTICOAGULATION CONSULT NOTE  Pharmacy Consult for IV Heparin Indication:  Hypertriglyceridemia induced pancreatitis  Patient Measurements: Height: 6' (182.9 cm) Weight: 127 kg (280 lb) IBW/kg (Calculated) : 77.6 Heparin Dosing Weight: 106 kg  Labs: Recent Labs    01/10/22 1433  HGB 14.1  HCT 43.5  PLT 274  CREATININE 0.75  TROPONINIHS 6    Estimated Creatinine Clearance: 172.5 mL/min (by C-G formula based on SCr of 0.75 mg/dL).   Medical History: Past Medical History:  Diagnosis Date   Allergy    seasonal   Diabetes mellitus without complication (HCC)    GERD (gastroesophageal reflux disease)    Hyperlipidemia    Hypertension    Pancreatitis     Medications:  No anticoagulation prior to admission per my chart review  Assessment: Patient is a 41 y/o M with medical history including diabetes, GERD, HLD, HTN, recurrent pancreatitis secondary to hypertriglyceridemia who presented to the ED 8/22 with abdominal pain. Patient is being started on insulin & heparin therapy for treatment. Pharmacy consulted to initiate and manage heparin infusion for hypertriglyceridemia induced pancreatitis.  There is limited dosing guidance for heparin in this indication   Baseline aPTT and PT-INR are pending. Baseline CBC within normal limits  Goal of Therapy:  aPTT 1.5 - 2x baseline  (36 - 48s) Monitor platelets by anticoagulation protocol: Yes   Plan:  --Will start IV heparin at 1000 units/hr --Re-check aPTT 6 hours from initiation of infusion Will target an aPTT of 1.5 - 2x patient's baseline --Daily CBC per protocol while on IV heparin --Continue to assess response / weight risk and benefits of continuing with IV heparin  Tressie Ellis 01/10/2022,7:07 PM

## 2022-01-10 NOTE — Assessment & Plan Note (Addendum)
-   This is clinically suspected based on his severe hypertriglyceridemia and history of recurrent pancreatitis due to such hypertriglyceridemia. - He will be kept NPO. - We will follow triglycerides level with management as below. - Pain management will be provided. - The patient's lipase was within normal with his previous bout of acute pancreatitis in March. - We will consider abdominal CT only if he has worsening symptoms to assess for complications, otherwise at this time I do not believe it will change her management.Marland Kitchen

## 2022-01-10 NOTE — ED Provider Notes (Signed)
[facility] Provider Note    Event Date/Time   First MD Initiated Contact with Patient 01/10/22 1533     (approximate)   History   Abdominal Pain   HPI  Dale Mendoza is a 41 y.o. male with diabetes GERD hyperlipidemia hypertension with recurrent pancreatitis due to hypertriglycerides who comes in for abdominal pain.  Patient denies any alcohol use.  On review of records patient was admitted back in March with a normal lipase level however CT scan did show evidence of acute pancreatitis.  Patient has a history of familial hypertriglycerides  Patient reports that he is had some intermittent upper abdominal pain for the past 2 weeks.  He states that the pain currently is a 4 out of 10 and he is not as bad of the level as he typically gets with the pancreatitis.  He states typically he can come and get some fluids pain meds nausea meds that he is able to go home however back in March got really bad he had to be admitted for a few days.  He reports that he is compliant with all of his cholesterol medication     Physical Exam   Triage Vital Signs: ED Triage Vitals  Enc Vitals Group     BP 01/10/22 1432 139/82     Pulse Rate 01/10/22 1432 93     Resp 01/10/22 1432 18     Temp 01/10/22 1432 98.3 F (36.8 C)     Temp Source 01/10/22 1432 Oral     SpO2 01/10/22 1432 94 %     Weight 01/10/22 1430 280 lb (127 kg)     Height 01/10/22 1430 6' (1.829 m)     Head Circumference --      Peak Flow --      Pain Score 01/10/22 1430 4     Pain Loc --      Pain Edu? --      Excl. in GC? --     Most recent vital signs: Vitals:   01/10/22 1432  BP: 139/82  Pulse: 93  Resp: 18  Temp: 98.3 F (36.8 C)  SpO2: 94%     General: Awake, no distress.  CV:  Good peripheral perfusion.  Resp:  Normal effort.  Abd:  No distention.  Slight tenderness in the left upper quadrant without any rebound or guarding. Other:     ED Results / Procedures / Treatments    Labs (all labs ordered are listed, but only abnormal results are displayed) Labs Reviewed  COMPREHENSIVE METABOLIC PANEL - Abnormal; Notable for the following components:      Result Value   Glucose, Bld 332 (*)    ALT 47 (*)    All other components within normal limits  URINALYSIS, ROUTINE W REFLEX MICROSCOPIC - Abnormal; Notable for the following components:   Color, Urine YELLOW (*)    APPearance CLEAR (*)    Glucose, UA >=500 (*)    Protein, ur 30 (*)    All other components within normal limits  CBG MONITORING, ED - Abnormal; Notable for the following components:   Glucose-Capillary 332 (*)    All other components within normal limits  LIPASE, BLOOD  CBC     EKG  My interpretation of EKG:  Normal sinus rate 92 without any ST elevation, T wave version lead III, normal intervals  RADIOLOGY   PROCEDURES:  Critical Care performed: No  .1-3 Lead EKG Interpretation  Performed by: Artis Delay  E, MD Authorized by: Concha Se, MD     Interpretation: normal     ECG rate:  90   ECG rate assessment: normal     Rhythm: sinus rhythm     Ectopy: none     Conduction: normal   .Critical Care  Performed by: Concha Se, MD Authorized by: Concha Se, MD   Critical care provider statement:    Critical care time (minutes):  30   Critical care was necessary to treat or prevent imminent or life-threatening deterioration of the following conditions:  Endocrine crisis   Critical care was time spent personally by me on the following activities:  Development of treatment plan with patient or surrogate, discussions with consultants, evaluation of patient's response to treatment, examination of patient, ordering and review of laboratory studies, ordering and review of radiographic studies, ordering and performing treatments and interventions, pulse oximetry, re-evaluation of patient's condition and review of old charts    MEDICATIONS ORDERED IN ED: Medications  insulin  regular, human (MYXREDLIN) 100 units/ 100 mL infusion (has no administration in time range)  lactated ringers infusion (has no administration in time range)  dextrose 5 % in lactated ringers infusion (has no administration in time range)  dextrose 50 % solution 0-50 mL (has no administration in time range)  potassium chloride 10 mEq in 100 mL IVPB (has no administration in time range)  sodium chloride 0.9 % bolus 1,000 mL (1,000 mLs Intravenous New Bag/Given 01/10/22 1619)  lactated ringers bolus 1,000 mL (1,000 mLs Intravenous New Bag/Given 01/10/22 1621)  ondansetron (ZOFRAN) injection 4 mg (4 mg Intravenous Given 01/10/22 1613)  oxyCODONE (Oxy IR/ROXICODONE) immediate release tablet 5 mg (5 mg Oral Given 01/10/22 1610)  ketorolac (TORADOL) 15 MG/ML injection 15 mg (15 mg Intravenous Given 01/10/22 1614)  acetaminophen (TYLENOL) tablet 1,000 mg (1,000 mg Oral Given 01/10/22 1610)     IMPRESSION / MDM / ASSESSMENT AND PLAN / ED COURSE  I reviewed the triage vital signs and the nursing notes.   Patient's presentation is most consistent with acute presentation with potential threat to life or bodily function.   Differential is pancreatitis, ACS.  Initial oxygen level slightly low at 94% he denied any respiratory symptoms and I rechecked it it was 96% therefore I do not feel that this represents pneumonia PE or ACS.  He reports this feels very similar to his recurrent pancreatitis is that he has had previously.  His abdominal exam is pretty reassuring he reports a little bit of left upper quadrant pain but there is no rebound or guarding.  He has had extensive work-up to make sure that it was not his gallbladder including MRCP's and this is all been negative.  We discussed CT imaging to evaluate for complications of pancreatitis but given his pain is not as bad as it normally is we discussed the pros and cons and elected to hold off patient will be treated with some IV fluids, pain meds, nausea meds and  will be reevaluated  Glucose elevated in 300s lipase is normal CMP shows slightly elevated ALT anion gap is normal no DKA CBC normal urine without evidence of UTI  Triglycerides are elevated at 900s.  I discussed with patient going home with oxycodone but my concern is that this will just mask the pain and his triglycerides could continue to elevate versus coming in for IV insulin to help lower the triglycerides and help decide additional medications that he should be on to help  prevent the triglycerides from remaining elevated.  Patient is willing to be admitted to the hospital.  Patient was started on an insulin drip.  Patient's potassium was within range that we can start the triglycerides and I have given an additional 2 doses while getting IV insulin.  I have discussed with the hospitalist Dr. Mertie Moores and patient will be admitted to the hospital  The patient is on the cardiac monitor to evaluate for evidence of arrhythmia and/or significant heart rate changes.      FINAL CLINICAL IMPRESSION(S) / ED DIAGNOSES   Final diagnoses:  Acute pancreatitis, unspecified complication status, unspecified pancreatitis type  Elevated triglycerides with high cholesterol     Rx / DC Orders   ED Discharge Orders     None        Note:  This document was prepared using Dragon voice recognition software and may include unintentional dictation errors.   Concha Se, MD 01/10/22 [phone]

## 2022-01-10 NOTE — ED Notes (Signed)
Report given to Ramonita Lab, ICU RN. Patient transported to ICU room 01 at this time.

## 2022-01-10 NOTE — Assessment & Plan Note (Addendum)
-   We will continue PPI therapy 

## 2022-01-10 NOTE — ED Notes (Signed)
Attempted to call report to ICU. Nurse not able to take report at this time.

## 2022-01-10 NOTE — Assessment & Plan Note (Addendum)
-   This should be covered with IV insulin drip. - While he is on insulin drip I am holding off his Lantus and Amaryl. - We will hold off metformin.

## 2022-01-10 NOTE — ED Triage Notes (Signed)
Pt via POV from home. Pt c/o intermittent LUQ abd pain, palpitations, and chills. States symptoms started 2 weeks ago. Denies NVD. Denies fever. Pt states he has a hx of pancreatitis. States he does not drink alcohol. Pt is A&OX4 and NAD

## 2022-01-10 NOTE — Assessment & Plan Note (Signed)
Use.- We will continue his antihypertensives.

## 2022-01-11 DIAGNOSIS — I10 Essential (primary) hypertension: Secondary | ICD-10-CM | POA: Diagnosis not present

## 2022-01-11 DIAGNOSIS — E781 Pure hyperglyceridemia: Secondary | ICD-10-CM | POA: Diagnosis not present

## 2022-01-11 DIAGNOSIS — K858 Other acute pancreatitis without necrosis or infection: Secondary | ICD-10-CM

## 2022-01-11 DIAGNOSIS — K219 Gastro-esophageal reflux disease without esophagitis: Secondary | ICD-10-CM | POA: Diagnosis not present

## 2022-01-11 LAB — BASIC METABOLIC PANEL
Anion gap: 8 (ref 5–15)
BUN: 14 mg/dL (ref 6–20)
CO2: 26 mmol/L (ref 22–32)
Calcium: 8.9 mg/dL (ref 8.9–10.3)
Chloride: 108 mmol/L (ref 98–111)
Creatinine, Ser: 0.81 mg/dL (ref 0.61–1.24)
GFR, Estimated: >60 mL/min (ref >60)
Glucose, Bld: 88 mg/dL (ref 70–99)
Potassium: 2.9 mmol/L — ABNORMAL LOW (ref 3.5–5.1)
Sodium: 142 mmol/L (ref 135–145)

## 2022-01-11 LAB — COMPREHENSIVE METABOLIC PANEL
ALT: 44 U/L (ref 0–44)
AST: 51 U/L — ABNORMAL HIGH (ref 15–41)
Albumin: 3.7 g/dL (ref 3.5–5.0)
Alkaline Phosphatase: 63 U/L (ref 38–126)
Anion gap: 6 (ref 5–15)
BUN: 13 mg/dL (ref 6–20)
CO2: 25 mmol/L (ref 22–32)
Calcium: 8.7 mg/dL — ABNORMAL LOW (ref 8.9–10.3)
Chloride: 107 mmol/L (ref 98–111)
Creatinine, Ser: 0.7 mg/dL (ref 0.61–1.24)
GFR, Estimated: >60 mL/min (ref >60)
Glucose, Bld: 128 mg/dL — ABNORMAL HIGH (ref 70–99)
Potassium: 3.4 mmol/L — ABNORMAL LOW (ref 3.5–5.1)
Sodium: 138 mmol/L (ref 135–145)
Total Bilirubin: 0.6 mg/dL (ref 0.3–1.2)
Total Protein: 6.4 g/dL — ABNORMAL LOW (ref 6.5–8.1)

## 2022-01-11 LAB — CBC
HCT: 40.6 % (ref 39.0–52.0)
Hemoglobin: 13.4 g/dL (ref 13.0–17.0)
MCH: 27.2 pg (ref 26.0–34.0)
MCHC: 33 g/dL (ref 30.0–36.0)
MCV: 82.5 fL (ref 80.0–100.0)
Platelets: 255 10*3/uL (ref 150–400)
RBC: 4.92 MIL/uL (ref 4.22–5.81)
RDW: 13.6 % (ref 11.5–15.5)
WBC: 9.8 10*3/uL (ref 4.0–10.5)
nRBC: 0 % (ref 0.0–0.2)

## 2022-01-11 LAB — GLUCOSE, CAPILLARY
Glucose-Capillary: 100 mg/dL — ABNORMAL HIGH (ref 70–99)
Glucose-Capillary: 119 mg/dL — ABNORMAL HIGH (ref 70–99)
Glucose-Capillary: 124 mg/dL — ABNORMAL HIGH (ref 70–99)
Glucose-Capillary: 130 mg/dL — ABNORMAL HIGH (ref 70–99)
Glucose-Capillary: 140 mg/dL — ABNORMAL HIGH (ref 70–99)
Glucose-Capillary: 160 mg/dL — ABNORMAL HIGH (ref 70–99)
Glucose-Capillary: 183 mg/dL — ABNORMAL HIGH (ref 70–99)
Glucose-Capillary: 188 mg/dL — ABNORMAL HIGH (ref 70–99)
Glucose-Capillary: 192 mg/dL — ABNORMAL HIGH (ref 70–99)
Glucose-Capillary: 211 mg/dL — ABNORMAL HIGH (ref 70–99)
Glucose-Capillary: 221 mg/dL — ABNORMAL HIGH (ref 70–99)
Glucose-Capillary: 231 mg/dL — ABNORMAL HIGH (ref 70–99)
Glucose-Capillary: 233 mg/dL — ABNORMAL HIGH (ref 70–99)
Glucose-Capillary: 238 mg/dL — ABNORMAL HIGH (ref 70–99)
Glucose-Capillary: 239 mg/dL — ABNORMAL HIGH (ref 70–99)
Glucose-Capillary: 239 mg/dL — ABNORMAL HIGH (ref 70–99)
Glucose-Capillary: 249 mg/dL — ABNORMAL HIGH (ref 70–99)
Glucose-Capillary: 252 mg/dL — ABNORMAL HIGH (ref 70–99)
Glucose-Capillary: 253 mg/dL — ABNORMAL HIGH (ref 70–99)
Glucose-Capillary: 253 mg/dL — ABNORMAL HIGH (ref 70–99)
Glucose-Capillary: 260 mg/dL — ABNORMAL HIGH (ref 70–99)
Glucose-Capillary: 275 mg/dL — ABNORMAL HIGH (ref 70–99)
Glucose-Capillary: 282 mg/dL — ABNORMAL HIGH (ref 70–99)
Glucose-Capillary: 317 mg/dL — ABNORMAL HIGH (ref 70–99)
Glucose-Capillary: 70 mg/dL (ref 70–99)
Glucose-Capillary: 94 mg/dL (ref 70–99)

## 2022-01-11 LAB — TRIGLYCERIDES: Triglycerides: 874 mg/dL — ABNORMAL HIGH (ref <150)

## 2022-01-11 LAB — APTT: aPTT: 27 seconds (ref 24–36)

## 2022-01-11 LAB — HEMOGLOBIN A1C
Hgb A1c MFr Bld: 12.6 % — ABNORMAL HIGH (ref 4.8–5.6)
Mean Plasma Glucose: 314.92 mg/dL

## 2022-01-11 MED ORDER — CHLORHEXIDINE GLUCONATE CLOTH 2 % EX PADS
6.0000 | MEDICATED_PAD | Freq: Every day | CUTANEOUS | Status: DC
  Administered 2022-01-11: 6 via TOPICAL

## 2022-01-11 MED ORDER — DEXTROSE IN LACTATED RINGERS 5 % IV SOLN
INTRAVENOUS | Status: DC
Start: 2022-01-11 — End: 2022-01-12

## 2022-01-11 MED ORDER — DEXTROSE 10 % IV SOLN: INTRAVENOUS | Status: DC

## 2022-01-11 MED ORDER — POTASSIUM CHLORIDE CRYS ER 20 MEQ PO TBCR
40.0000 meq | EXTENDED_RELEASE_TABLET | Freq: Once | ORAL | Status: AC
  Administered 2022-01-11: 40 meq via ORAL
  Filled 2022-01-11: qty 2

## 2022-01-11 MED ORDER — DEXTROSE 50 % IV SOLN: 25.0000 mL | Freq: Once | INTRAVENOUS | Status: DC

## 2022-01-11 MED ORDER — ENOXAPARIN SODIUM 80 MG/0.8ML IJ SOSY: 0.5000 mg/kg | PREFILLED_SYRINGE | INTRAMUSCULAR | Status: DC

## 2022-01-11 MED ORDER — ENOXAPARIN SODIUM 30 MG/0.3ML IJ SOSY: 30.0000 mg | PREFILLED_SYRINGE | Freq: Two times a day (BID) | INTRAMUSCULAR | Status: DC

## 2022-01-11 NOTE — Inpatient Diabetes Management (Addendum)
Inpatient Diabetes Program Recommendations  AACE/ADA: New Consensus Statement on Inpatient Glycemic Control (2015)  Target Ranges:  Prepandial:   less than 140 mg/dL      Peak postprandial:   less than 180 mg/dL (1-2 hours)      Critically ill patients:  140 - 180 mg/dL    Latest Reference Range & Units 01/10/22 14:33  Triglycerides <150 mg/dL 938 (H)  (H): Data is abnormally high  Latest Reference Range & Units 01/10/22 21:51 01/10/22 22:56 01/10/22 23:59 01/11/22 00:28 01/11/22 01:04 01/11/22 02:02 01/11/22 02:57 01/11/22 04:01 01/11/22 04:58 01/11/22 06:01  Glucose-Capillary 70 - 99 mg/dL 182 (H) 993 (H) 716 (H) 94 70 119 (H) 124 (H) 130 (H) 140 (H) 160 (H)  (H): Data is abnormally high    Admit with: Acute pancreatitis due to hypertriglyceridemia  History: DM2, Dyslipidemia with hypertriglyceridemia and history of recurrent pancreatitis  Home DM Meds: Amaryl 4 mg daily       Metformin 1000 mg BID        Tresiba 160 units QHS  Current Orders: IV Insulin Drip @ 0.05 units/kg     D10% IVF 100 cc/hr     Remains on IV Insulin drip this AM  Triglyceride level at 3:33am was 874  CBGs stable  MD- Please consider checking current A1c level.  Last one on file was 11.6% (March 2023)   Endocrinologist: Dr. Gershon Crane with [facility] Last Seen Sept 2022 Was told to take the following: Keep taking two Metformin pills twice a day. Keep taking your glimepiride Keep tresiba at 120. Go back on Jardiance. Once back on Jardiance, if fasting sugars are still in the 200s, go up on your tresiba. Go up by 2 units daily until sugars are in the 100s. Once you see morning sugars in the 100s (preferably below 170), then stop increasing and stay on your current dose. If you get to 160 units daily, stop increasing and stay on 160.   --Will follow patient during hospitalization--  Ambrose Finland RN, MSN, CDCES Diabetes Coordinator Inpatient Glycemic Control Team Team Pager:  [phone] (8a-5p)

## 2022-01-11 NOTE — Progress Notes (Signed)
       CROSS COVER NOTE  NAME: SHAYNE DIGUGLIELMO MRN: [id number] DOB : 1981-02-14    Date of Service   01/11/2022   HPI/Events of Note   Hyperglycemic throughout the day.  Interventions   Plan: D10-->D5LR X X      This document was prepared using Dragon voice recognition software and may include unintentional dictation errors.  Bishop Limbo DNP, MHA, FNP-BC Nurse Practitioner [facility] Pager [phone]

## 2022-01-11 NOTE — Progress Notes (Signed)
[facility]  - [place] at [facility]   Dale NAME: Dale Mendoza    MR#:  [id number]  DATE OF BIRTH:  [birth year]  SUBJECTIVE:  family at bedside. Dale overall feeling better. No abdominal pain. Sugars stable. On insulin drip for elevated triglyceride. Blood pressure stable. Dale has been noncompliant with his diet due to his job and hours working   VITALS:  Blood pressure 104/78, pulse 80, temperature 98.3 F (36.8 C), temperature source Oral, resp. rate 18, height 6' (1.829 m), weight 127 kg, SpO2 96 %.  PHYSICAL EXAMINATION:   GENERAL:  41 y.o.-year-old Dale lying in the bed with no acute distress. obese LUNGS: Normal breath sounds bilaterally, no wheezing, rales, rhonchi.  CARDIOVASCULAR: S1, S2 normal. No murmurs, rubs, or gallops.  ABDOMEN: Soft, nontender, nondistended. Bowel sounds present.  EXTREMITIES: No  edema b/l.    NEUROLOGIC: nonfocal  Dale is alert and awake SKIN: No obvious rash, lesion, or ulcer.   LABORATORY PANEL:  CBC Recent Labs  Lab 01/11/22 0333  WBC 9.8  HGB 13.4  HCT 40.6  PLT 255    Chemistries  Recent Labs  Lab 01/11/22 0333  NA 138  K 3.4*  CL 107  CO2 25  GLUCOSE 128*  BUN 13  CREATININE 0.70  CALCIUM 8.7*  AST 51*  ALT 44  ALKPHOS 63  BILITOT 0.6    Assessment and Plan Dale Mendoza is a 41 y.o. 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 -- continue IV insulin drip along with dextrose -- normal lipase -- tolerating PO diet advance to full liquid -- PRN pain meds  Familial hypertriglyceridemia -- came in with triglyceride of 975--- 874 -- will continue insulin drip with dextrose till  triglycerides less than 500 -- continue statins and fenofibrate -- no indication for heparin drip (according to Upt[employer]) -- Dale advised to resume following Dale Mendoza at [facility]  Uncontrolled type 2 diabetes mellitus with hyperglycemia, with long-term current use of insulin (HCC), dietary noncompliance - continue insulin drip -- will resume home meds along with insulin once of drip -- recommended dietitian consultation. Dale Mendoza he has had them in the past and he will try to follow recommendations given before  Hypertension  --continue lisinopril  Genella Rife -- PPI  Body mass index is 37.97 kg/m. Obesity--- diet and exercise discussed   Procedures:none Family communication :family Consults :none CODE STATUS: full DVT Prophylaxis : enoxaparin Level of care: Stepdown Status is: Inpatient Remains inpatient appropriate because: insulin gtt for hyper TG    TOTAL TIME TAKING CARE OF [name]: 35 minutes.  >50% time spent on counselling and coordination of care  Note: This dictation was prepared with Dragon dictation along with smaller phrase technology. Any transcriptional errors that result from this process are unintentional.  Enedina Finner M.D    [facility]   CC: Primary care physician; Shane Crutch, [place]

## 2022-01-11 NOTE — Progress Notes (Signed)
PHARMACIST - PHYSICIAN COMMUNICATION  CONCERNING:  Enoxaparin (Lovenox) for DVT Prophylaxis    RECOMMENDATION: Patient was prescribed enoxaprin 40mg  q24 hours for VTE prophylaxis.   Filed Weights   01/10/22 1430  Weight: 127 kg (280 lb)    Body mass index is 37.97 kg/m.  Estimated Creatinine Clearance: 172.5 mL/min (by C-G formula based on SCr of 0.7 mg/dL).   Based on [facility] policy patient is candidate for enoxaparin 0.5mg /kg TBW SQ every 24 hours based on BMI being >30  DESCRIPTION: Pharmacy has adjusted enoxaparin dose per [facility] policy.  Patient is now receiving enoxaparin 0.5 mg/kg every 24 hours    [facility], PharmD Clinical Pharmacist  01/11/2022 10:48 AM

## 2022-01-11 NOTE — Consult Note (Addendum)
ANTICOAGULATION CONSULT NOTE  Pharmacy Consult for IV Heparin Indication:  Hypertriglyceridemia induced pancreatitis  Patient Measurements: Height: 6' (182.9 cm) Weight: 127 kg (280 lb) IBW/kg (Calculated) : 77.6 Heparin Dosing Weight: 106 kg  Labs: Recent Labs    01/10/22 1433 01/10/22 1938 01/10/22 2124 01/11/22 0058 01/11/22 0333  HGB 14.1  --   --   --  13.4  HCT 43.5  --   --   --  40.6  PLT 274  --   --   --  255  APTT  --  24  --   --  27  LABPROT  --  12.6  --   --   --   INR  --  1.0  --   --   --   CREATININE 0.75 0.73 0.65 0.81 0.70  TROPONINIHS 6  --   --   --   --      Estimated Creatinine Clearance: 172.5 mL/min (by C-G formula based on SCr of 0.7 mg/dL).   Medical History: Past Medical History:  Diagnosis Date   Allergy    seasonal   Diabetes mellitus without complication (HCC)    GERD (gastroesophageal reflux disease)    Hyperlipidemia    Hypertension    Pancreatitis     Medications:  No anticoagulation prior to admission per my chart review  Assessment: Patient is a 41 y/o M with medical history including diabetes, GERD, HLD, HTN, recurrent pancreatitis secondary to hypertriglyceridemia who presented to the ED 8/22 with abdominal pain. Patient is being started on insulin & heparin therapy for treatment. Pharmacy consulted to initiate and manage heparin infusion for hypertriglyceridemia induced pancreatitis.  There is limited dosing guidance for heparin in this indication   Baseline aPTT and PT-INR are pending. Baseline CBC within normal limits  Goal of Therapy:  aPTT 1.5 - 2x baseline  (36 - 48s) Monitor platelets by anticoagulation protocol: Yes  08/23 0333 aPTT 27, subtherapeutic   Plan:  --Will increase IV heparin to 1100 units/hr --Re-check aPTT 6 hours after rate change Will target an aPTT of 1.5 - 2x patient's baseline --Daily CBC per protocol while on IV heparin --Continue to assess response / weight risk and benefits of  continuing with IV heparin  Otelia Sergeant, PharmD, [facility] 01/11/2022 4:07 AM

## 2022-01-11 NOTE — Plan of Care (Signed)
       CROSS COVER NOTE  NAME: KACI FREEL MRN: [id number] DOB : 11/22/1980    Date of Service   01/11/22  HPI/Events of Note   Secure chat #1 blood sugar at 100 Secure chat #2 blood sugar of 70  Assessment and  Interventions   Assessment: Admitted earlier with hypertriglyceridemia pancreatitis and hyper glycemia with initial blood sugar 322.  Initial triglyceride was 976.  Placed on insulin infusion as well as D5  Plan: Intervention #1: Patient was started on D10 to replace D5 and insulin continued at the current rate of 0.1 mg/kg/h and hourly CBG continued Intervention #2: Due to blood sugar dropping to 70, insulin infusion rate dropped to 0.05 mg/kg/h and D10 continued.  Patient given a sugary beverage.  Has as needed D50 on hand as discussed with nurse.  Continue hourly CBGs Having a repeat triglyceride level at 5 AM, 12 hours from first check

## 2022-01-12 DIAGNOSIS — K219 Gastro-esophageal reflux disease without esophagitis: Secondary | ICD-10-CM | POA: Diagnosis not present

## 2022-01-12 DIAGNOSIS — K858 Other acute pancreatitis without necrosis or infection: Secondary | ICD-10-CM | POA: Diagnosis not present

## 2022-01-12 DIAGNOSIS — E1165 Type 2 diabetes mellitus with hyperglycemia: Secondary | ICD-10-CM | POA: Diagnosis not present

## 2022-01-12 DIAGNOSIS — E781 Pure hyperglyceridemia: Secondary | ICD-10-CM | POA: Diagnosis not present

## 2022-01-12 LAB — BASIC METABOLIC PANEL
Anion gap: 5 (ref 5–15)
BUN: 11 mg/dL (ref 6–20)
CO2: 28 mmol/L (ref 22–32)
Calcium: 9 mg/dL (ref 8.9–10.3)
Chloride: 109 mmol/L (ref 98–111)
Creatinine, Ser: 0.75 mg/dL (ref 0.61–1.24)
GFR, Estimated: >60 mL/min (ref >60)
Glucose, Bld: 178 mg/dL — ABNORMAL HIGH (ref 70–99)
Potassium: 3.9 mmol/L (ref 3.5–5.1)
Sodium: 142 mmol/L (ref 135–145)

## 2022-01-12 LAB — GLUCOSE, CAPILLARY
Glucose-Capillary: 159 mg/dL — ABNORMAL HIGH (ref 70–99)
Glucose-Capillary: 164 mg/dL — ABNORMAL HIGH (ref 70–99)
Glucose-Capillary: 171 mg/dL — ABNORMAL HIGH (ref 70–99)
Glucose-Capillary: 194 mg/dL — ABNORMAL HIGH (ref 70–99)
Glucose-Capillary: 195 mg/dL — ABNORMAL HIGH (ref 70–99)
Glucose-Capillary: 201 mg/dL — ABNORMAL HIGH (ref 70–99)
Glucose-Capillary: 206 mg/dL — ABNORMAL HIGH (ref 70–99)
Glucose-Capillary: 213 mg/dL — ABNORMAL HIGH (ref 70–99)
Glucose-Capillary: 221 mg/dL — ABNORMAL HIGH (ref 70–99)

## 2022-01-12 LAB — TRIGLYCERIDES: Triglycerides: 355 mg/dL — ABNORMAL HIGH (ref <150)

## 2022-01-12 LAB — LIPASE, BLOOD: Lipase: 26 U/L (ref 11–51)

## 2022-01-12 MED ORDER — LISINOPRIL 20 MG PO TABS: 20.0000 mg | ORAL_TABLET | Freq: Two times a day (BID) | ORAL | 3 refills | Status: AC

## 2022-01-12 MED ORDER — INSULIN GLARGINE-YFGN 100 UNIT/ML ~~LOC~~ SOLN
40.0000 [IU] | Freq: Two times a day (BID) | SUBCUTANEOUS | Status: DC
  Filled 2022-01-12: qty 0.4

## 2022-01-12 MED ORDER — INSULIN GLARGINE-YFGN 100 UNIT/ML ~~LOC~~ SOLN
120.0000 [IU] | Freq: Every day | SUBCUTANEOUS | Status: DC
  Administered 2022-01-12: 120 [IU] via SUBCUTANEOUS
  Filled 2022-01-12: qty 1.2

## 2022-01-12 MED ORDER — GLIMEPIRIDE 4 MG PO TABS: 4.0000 mg | ORAL_TABLET | Freq: Every day | ORAL | 3 refills | Status: DC

## 2022-01-12 MED ORDER — FEXOFENADINE HCL 60 MG PO TABS: 60.0000 mg | ORAL_TABLET | Freq: Every day | ORAL | 3 refills | Status: AC

## 2022-01-12 MED ORDER — FENOFIBRATE 145 MG PO TABS: 145.0000 mg | ORAL_TABLET | Freq: Every day | ORAL | 6 refills | Status: AC

## 2022-01-12 MED ORDER — ATORVASTATIN CALCIUM 40 MG PO TABS: 40.0000 mg | ORAL_TABLET | Freq: Every day | ORAL | 3 refills | Status: DC

## 2022-01-12 MED ORDER — INSULIN GLARGINE-YFGN 100 UNIT/ML ~~LOC~~ SOLN: 120.0000 [IU] | Freq: Two times a day (BID) | SUBCUTANEOUS | Status: DC

## 2022-01-12 MED ORDER — METFORMIN HCL ER 500 MG PO TB24: 1000.0000 mg | ORAL_TABLET | Freq: Two times a day (BID) | ORAL | 3 refills | Status: AC

## 2022-01-12 MED ORDER — TRESIBA FLEXTOUCH 200 UNIT/ML ~~LOC~~ SOPN: 160.0000 [IU] | PEN_INJECTOR | Freq: Every evening | SUBCUTANEOUS | 3 refills | Status: AC

## 2022-01-12 NOTE — Inpatient Diabetes Management (Addendum)
Inpatient Diabetes Program Recommendations  AACE/ADA: New Consensus Statement on Inpatient Glycemic Control (2015)  Target Ranges:  Prepandial:   less than 140 mg/dL      Peak postprandial:   less than 180 mg/dL (1-2 hours)      Critically ill patients:  140 - 180 mg/dL    Latest Reference Range & Units 08/14/21 13:08 01/11/22 03:33  Hemoglobin A1C 4.8 - 5.6 % 11.6 (H) 12.6 (H)  314 mg/dl  (H): Data is abnormally high  Latest Reference Range & Units 01/12/22 05:30  Triglycerides <150 mg/dL 952 (H)  (H): Data is abnormally high  Latest Reference Range & Units 01/11/22 21:53 01/11/22 22:56 01/11/22 23:53 01/12/22 00:53 01/12/22 01:53 01/12/22 02:58 01/12/22 03:52 01/12/22 04:47 01/12/22 05:55 01/12/22 07:09  Glucose-Capillary 70 - 99 mg/dL 841 (H)  IV Insulin Drip Infusing 231 (H) 221 (H) 213 (H) 221 (H) 206 (H) 194 (H) 201 (H) 171 (H) 164 (H)  (H): Data is abnormally high  Admit with: Acute pancreatitis due to hypertriglyceridemia   History: DM2, Dyslipidemia with hypertriglyceridemia and history of recurrent pancreatitis   Home DM Meds: Amaryl 4 mg daily                             Metformin 1000 mg BID                             Tresiba 160 units QHS   Current Orders: IV Insulin Drip @ 0.05 units/kg                           D5%LR IVF 100 cc/hr   Remains on IV Insulin drip this AM   Triglyceride level at 5:30 am was 355   CBGs were elevated yesterday as pt restarted PO diet--IVF changed from D10% to D5%   MD- Note TG level down to 355 on Lab this AM.  If plan is to transition to SQ Insulin from the IV Insulin Drip, recommend the following:  1. Start Semglee 40 units BID (50% home dose) Make sure to continue IV Insulin Drip for 2 hours after 1st dose Semglee on board  2. Start Novolog Resistant Correction Scale/ SSI (0-20 units) TID AC + HS  3. Start Novolog Meal Coverage: Novolog 6 units TID with meals HOLD if pt eats <50% meals    Endocrinologist: Dr. Gershon Crane  with [facility] Last Seen Sept 2022 Was told to take the following: Keep taking two Metformin pills twice a day. Keep taking your glimepiride Keep tresiba at 120. Go back on Jardiance. Once back on Jardiance, if fasting sugars are still in the 200s, go up on your tresiba. Go up by 2 units daily until sugars are in the 100s. Once you see morning sugars in the 100s (preferably below 170), then stop increasing and stay on your current dose. If you get to 160 units daily, stop increasing and stay on 160    --Will follow patient during hospitalization--  Ambrose Finland RN, MSN, CDCES Diabetes Coordinator Inpatient Glycemic Control Team Team Pager: [phone] (8a-5p)

## 2022-01-12 NOTE — Progress Notes (Signed)
Work note provided to patient before discharge.

## 2022-01-12 NOTE — Progress Notes (Signed)
Patient to be discharged home. Wife will transport patient via PMV home. Vitals WNL at time of DC. All discharge instructions reviewed with patient and wife. All questions answered at this time. 3 PIVs removed by this RN. Patient belongings in room packed by wife and all sent home. Nothing personal remaining in room.

## 2022-01-12 NOTE — TOC Progression Note (Signed)
Transition of Care [facility]) - Progression Note    Patient Details  Name: Dale Mendoza MRN: [id number] Date of Birth: 12-25-1980  Transition of Care [facility]) CM/SW Contact  Allayne Butcher, RN Phone Number: 01/12/2022, 11:17 AM  Clinical Narrative:     Transition of Care [facility]) Screening Note   Patient Details  Name: Dale Mendoza Date of Birth: 1981-01-18   Transition of Care [facility]) CM/SW Contact:    Allayne Butcher, RN Phone Number: 01/12/2022, 11:18 AM    Transition of Care [facility]) has reviewed patient and no TOC needs have been identified at this time. We will continue to monitor patient advancement through interdisciplinary progression rounds. If new patient transition needs arise, please place a TOC consult.          Expected Discharge Plan and Services           Expected Discharge Date: 01/12/22                                     Social Determinants of Health (SDOH) Interventions    Readmission Risk Interventions     No data to display

## 2022-01-12 NOTE — Progress Notes (Addendum)
Patient CBG in the 200s throughout the day. Contacted MD Patel via secure chat 2x with orders to decrease d10 from 100 to 75, then 75 down to 50. Patient CBG was trending down in response to fluid changes.

## 2022-01-12 NOTE — Discharge Summary (Signed)
Physician Discharge Summary   Patient: Dale Mendoza MRN: [id number] DOB: July 30, 1980  Admit date:     01/10/2022  Discharge date: 01/12/22  Discharge Physician: Enedina Finner   PCP: Shane Crutch, PA   Recommendations at discharge:    F/u PCP in 1-2 weeks keep log of his sugars and discuss with PCP  Discharge Diagnoses: Principal Problem:   Acute pancreatitis Active Problems:   Familial hypertriglyceridemia   Uncontrolled type 2 diabetes mellitus with hyperglycemia, with long-term current use of insulin (HCC)   GERD without esophagitis   Essential hypertension   Hospital Course:  Dale Mendoza is a 41 y.o. 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 Maurice March at [facility]    Uncontrolled type 2 diabetes mellitus with hyperglycemia, with long-term current use of insulin (HCC), dietary noncompliance - continue insulin drip-- now discontinued -- will resume home meds along with insulin home dose -- diet and exercise discussed  Hypertension  --continue lisinopril   Genella Rife -- PPI   Body mass index is 37.97 kg/m. Obesity--- diet and exercise discussed     Procedures:none Family  communication :family Consults :none CODE STATUS: full DVT Prophylaxis : enoxaparin      Disposition: Home Diet recommendation:  Discharge Diet Orders (From admission, onward)     Start     Ordered   01/12/22 0000  Diet - low sodium heart healthy        01/12/22 1038   01/12/22 0000  Diet Carb Modified        01/12/22 1038           Cardiac and Carb modified diet DISCHARGE MEDICATION: Allergies as of 01/12/2022   No Known Allergies      Medication List     STOP taking these medications    insulin glargine 100 unit/mL Sopn Commonly known as: LANTUS   loratadine 10 MG tablet Commonly known as: CLARITIN       TAKE these medications    albuterol 108 (90 Base) MCG/ACT inhaler Commonly known as: VENTOLIN HFA Inhale 1-2 puffs into the lungs every 6 (six) hours as needed for wheezing or shortness of breath.   ALIVE DIABETIC MULTIVITAMIN PO Take 1 tablet by mouth daily.   atorvastatin 40 MG tablet Commonly known as: LIPITOR Take 1 tablet (40 mg total) by mouth daily.   Cetirizine HCl 10 MG Caps Take 1 tablet by mouth daily.   fenofibrate 145 MG tablet Commonly known as: TRICOR Take 1 tablet (145 mg total) by mouth daily. Will need fasting labs in july   fexofenadine 60 MG tablet Commonly known as: ALLEGRA Take 1 tablet (60 mg total) by mouth daily.   Fish Oil 1200 MG Caps Take by mouth 2 (two) times daily.   fluticasone 50 MCG/ACT nasal spray Commonly known as:  FLONASE Place 1 spray into both nostrils 2 (two) times daily.   glimepiride 4 MG tablet Commonly known as: AMARYL Take 1 tablet (4 mg total) by mouth daily.   ibuprofen 400 MG tablet Commonly known as: ADVIL Take 1 tablet (400 mg total) by mouth every 6 (six) hours as needed for mild pain or headache.   Insulin Pen Needle 30G X 8 MM Misc Commonly known as: NOVOFINE Inject 10 each into the skin as needed.   lisinopril 20 MG tablet Commonly known as: ZESTRIL Take 1 tablet (20 mg  total) by mouth 2 (two) times daily.   metFORMIN 500 MG 24 hr tablet Commonly known as: GLUCOPHAGE-XR Take 2 tablets (1,000 mg total) by mouth 2 (two) times daily.   ondansetron 4 MG tablet Commonly known as: Zofran Take 1 tablet (4 mg total) by mouth every 8 (eight) hours as needed for nausea or vomiting.   ONE TOUCH ULTRA TEST test strip Generic drug: glucose blood USE AS DIRECTED THREE TIMES A DAY   pantoprazole 40 MG tablet Commonly known as: PROTONIX Take 40 mg by mouth 2 (two) times daily.   Evaristo Bury FlexTouch 200 UNIT/ML FlexTouch Pen Generic drug: insulin degludec Inject 160 Units into the skin at bedtime.        Follow-up Information     Shane Crutch, [place]. Schedule an appointment as soon as possible for a visit in 1 week(s).   Specialty: Family Medicine Why: hospital f/u Contact information: [address] [place] [address] [phone]                Discharge Exam: Ceasar Mons Weights   01/10/22 1430  Weight: 127 kg     Condition at discharge: fair  The results of significant diagnostics from this hospitalization (including imaging, microbiology, ancillary and laboratory) are listed below for reference.   Imaging Studies: No results found.  Microbiology: Results for orders placed or performed during the hospital encounter of 09/25/15  Rapid Influenza A&B Antigens ([facility] only)     Status: None   Collection Time: 09/25/15  4:06 PM   Specimen: Respiratory  Result Value Ref Range Status   Influenza A [facility]) NEGATIVE NEGATIVE Final   Influenza B ([facility]) NEGATIVE NEGATIVE Final    Labs: CBC: Recent Labs  Lab 01/10/22 1433 01/11/22 0333  WBC 6.7 9.8  HGB 14.1 13.4  HCT 43.5 40.6  MCV 83.3 82.5  PLT 274 255   Basic Metabolic Panel: Recent Labs  Lab 01/10/22 1938 01/10/22 2124 01/11/22 0058 01/11/22 0333 01/12/22 0701  NA 140 141 142 138 142  K 3.7 3.3* 2.9* 3.4* 3.9  CL 107 107 108 107 109  CO2 25 25 26 25 28   GLUCOSE 187*  164* 88 128* 178*  BUN 12 13 14 13 11   CREATININE 0.73 0.65 0.81 0.70 0.75  CALCIUM 8.6* 8.7* 8.9 8.7* 9.0   Liver Function Tests: Recent Labs  Lab 01/10/22 1433 01/11/22 0333  AST 40 51*  ALT 47* 44  ALKPHOS 114 63  BILITOT 0.7 0.6  PROT 7.2 6.4*  ALBUMIN 4.1 3.7   CBG: Recent Labs  Lab 01/12/22 0447 01/12/22 0555 01/12/22 0709 01/12/22 0819 01/12/22 0923  GLUCAP 201* 171* 164* 159* 195*    Discharge time spent: greater than 30 minutes.  Signed: 01/14/22, MD [facility] 01/12/2022

## 2022-01-12 NOTE — Inpatient Diabetes Management (Signed)
Addendum 10:30am  Met w/ pt and wife prior to discharge to review Current A1c of 12.6%, home diabetes meds, diabetes nutrition plan, etc.  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  Keeps cooler in his loader and only gets breaks to use the restroom.  Takes his oral DM meds with him in a pill box but often takes [place] insulin on an inconsistent timing base.  Does not usually miss doses of insulin but timing is not always consistent.  Not checking CBGs frequently.  Stated to me that he stopped Jardiance b/c of the cost.  Told me the [place] Aas insulin was going to cost him $1700 per month but now has coupon where he can get the [place] for $99 per month.  Pt and wife asking me about alternative basal insulin options and we reviewed them--I encouraged pt and wife to call their insurance company to find out which basal insulin is most affordable and then ask ENDO to switch him if needed.  Pt told me he uses the [facility] for PCP needs and also goes to Dr. Honor Junes for diabetes--Told me it's expensive to see Dr. Honor Junes b/c his insurance has a high deductible.  Pt told me he would like to just use the [facility] to help manage his diabetes, however, I encouraged pt to continue with [facility] ENDO as he has uncontrolled glucose levels with high A1c and needs better control at home.  We discussed making better nutrition choices at home (pt admitted to eating lots of high carb foods and snacks and sweets along with regular gatorade) and going back to see Dr. Honor Junes in 3 months and having his A1c rechecked to see if the nutrition changes helped.  I did discuss with pt the possible need to start meal time insulin at home with rapid-acting insulin if his A1c does not come down in 3 months with nutrition changes.  Spoke with patient about his current A1c of 12.6%.  Explained what an A1c is and what it measures.  Reminded patient that his goal A1c is 7% or less per ADA standards to prevent both  acute and long-term complications.  Explained to patient the extreme importance of good glucose control at home.  Encouraged patient to check his CBGs at least bid at home (fasting and another check within the day) and to record all CBGs in a logbook for his PCP and Endocrinologist to review.  Also discussed DM diet information with patient.  Encouraged patient to avoid beverages with sugar (regular soda, sweet tea, lemonade, fruit juice) and to consume mostly water.  Discussed what foods contain carbohydrates and how carbohydrates affect the body's blood sugar levels.  Encouraged patient to be careful with his portion sizes (especially grains, starchy vegetables, and fruits).  Explained to patient that men should have 60-75 grams of carbohydrates per meal per day.  We also talked about significantly lowering his carb consumption and trying sandwiches with lettuce wraps instead of bread and avoiding high carb snacks like chips and crackers and trying peanut butter with celery and cheese.  Pt and wife very appreciative of visit and did not have any further questions for me at this time.   --Will follow patient during hospitalization--  Wyn Quaker RN, MSN, [place] Diabetes Coordinator Inpatient Glycemic Control Team Team Pager: [phone] (8a-5p)

## 2022-01-12 NOTE — Discharge Instructions (Signed)
Keep log of sugars at home 

## 2022-01-16 ENCOUNTER — Emergency Department: Payer: BC Managed Care – PPO

## 2022-01-16 ENCOUNTER — Emergency Department
Admission: EM | Admit: 2022-01-16 | Discharge: 2022-01-17 | Disposition: A | Discharge status: Home / Self Care | Payer: BC Managed Care – PPO | Attending: Emergency Medicine | Admitting: Emergency Medicine

## 2022-01-16 ENCOUNTER — Other Ambulatory Visit: Payer: Self-pay

## 2022-01-16 DIAGNOSIS — R110 Nausea: Secondary | ICD-10-CM | POA: Diagnosis not present

## 2022-01-16 DIAGNOSIS — I10 Essential (primary) hypertension: Secondary | ICD-10-CM | POA: Insufficient documentation

## 2022-01-16 DIAGNOSIS — R1013 Epigastric pain: Secondary | ICD-10-CM | POA: Diagnosis present

## 2022-01-16 DIAGNOSIS — E1165 Type 2 diabetes mellitus with hyperglycemia: Secondary | ICD-10-CM | POA: Insufficient documentation

## 2022-01-16 LAB — URINALYSIS, ROUTINE W REFLEX MICROSCOPIC
Bacteria, UA: NONE SEEN
Bilirubin Urine: NEGATIVE
Glucose, UA: 150 mg/dL — AB
Hgb urine dipstick: NEGATIVE
Ketones, ur: NEGATIVE mg/dL
Leukocytes,Ua: NEGATIVE
Nitrite: NEGATIVE
Protein, ur: 100 mg/dL — AB
Specific Gravity, Urine: 1.03 (ref 1.005–1.030)
Squamous Epithelial / HPF: NONE SEEN (ref 0–5)
pH: 5 (ref 5.0–8.0)

## 2022-01-16 LAB — CBC WITH DIFFERENTIAL/PLATELET
Abs Immature Granulocytes: 0.03 10*3/uL (ref 0.00–0.07)
Basophils Absolute: 0.1 10*3/uL (ref 0.0–0.1)
Basophils Relative: 1 %
Eosinophils Absolute: 0.2 10*3/uL (ref 0.0–0.5)
Eosinophils Relative: 2 %
HCT: 44.6 % (ref 39.0–52.0)
Hemoglobin: 14.6 g/dL (ref 13.0–17.0)
Immature Granulocytes: 0 %
Lymphocytes Relative: 36 %
Lymphs Abs: 3 10*3/uL (ref 0.7–4.0)
MCH: 27.3 pg (ref 26.0–34.0)
MCHC: 32.7 g/dL (ref 30.0–36.0)
MCV: 83.4 fL (ref 80.0–100.0)
Monocytes Absolute: 0.6 10*3/uL (ref 0.1–1.0)
Monocytes Relative: 7 %
Neutro Abs: 4.4 10*3/uL (ref 1.7–7.7)
Neutrophils Relative %: 54 %
Platelets: 318 10*3/uL (ref 150–400)
RBC: 5.35 MIL/uL (ref 4.22–5.81)
RDW: 14 % (ref 11.5–15.5)
WBC: 8.2 10*3/uL (ref 4.0–10.5)
nRBC: 0 % (ref 0.0–0.2)

## 2022-01-16 LAB — COMPREHENSIVE METABOLIC PANEL
ALT: 56 U/L — ABNORMAL HIGH (ref 0–44)
AST: 74 U/L — ABNORMAL HIGH (ref 15–41)
Albumin: 4.3 g/dL (ref 3.5–5.0)
Alkaline Phosphatase: 75 U/L (ref 38–126)
Anion gap: 8 (ref 5–15)
BUN: 18 mg/dL (ref 6–20)
CO2: 26 mmol/L (ref 22–32)
Calcium: 9.6 mg/dL (ref 8.9–10.3)
Chloride: 104 mmol/L (ref 98–111)
Creatinine, Ser: 0.88 mg/dL (ref 0.61–1.24)
GFR, Estimated: >60 mL/min (ref >60)
Glucose, Bld: 177 mg/dL — ABNORMAL HIGH (ref 70–99)
Potassium: 3.8 mmol/L (ref 3.5–5.1)
Sodium: 138 mmol/L (ref 135–145)
Total Bilirubin: 0.6 mg/dL (ref 0.3–1.2)
Total Protein: 7.4 g/dL (ref 6.5–8.1)

## 2022-01-16 LAB — TROPONIN I (HIGH SENSITIVITY)
Troponin I (High Sensitivity): 10 ng/L (ref <18)
Troponin I (High Sensitivity): 10 ng/L (ref <18)

## 2022-01-16 LAB — LIPASE, BLOOD: Lipase: 30 U/L (ref 11–51)

## 2022-01-16 LAB — TRIGLYCERIDES: Triglycerides: 305 mg/dL — ABNORMAL HIGH (ref <150)

## 2022-01-16 MED ORDER — SODIUM CHLORIDE 0.9 % IV BOLUS
1000.0000 mL | Freq: Once | INTRAVENOUS | Status: AC
  Administered 2022-01-16: 1000 mL via INTRAVENOUS

## 2022-01-16 MED ORDER — MORPHINE SULFATE (PF) 4 MG/ML IV SOLN
4.0000 mg | Freq: Once | INTRAVENOUS | Status: AC
  Administered 2022-01-16: 4 mg via INTRAVENOUS
  Filled 2022-01-16: qty 1

## 2022-01-16 MED ORDER — ALUM & MAG HYDROXIDE-SIMETH 200-200-20 MG/5ML PO SUSP
30.0000 mL | Freq: Once | ORAL | Status: AC
  Administered 2022-01-16: 30 mL via ORAL
  Filled 2022-01-16: qty 30

## 2022-01-16 MED ORDER — IOHEXOL 300 MG/ML  SOLN
100.0000 mL | Freq: Once | INTRAMUSCULAR | Status: AC | PRN
  Administered 2022-01-16: 100 mL via INTRAVENOUS

## 2022-01-16 MED ORDER — FAMOTIDINE IN NACL 20-0.9 MG/50ML-% IV SOLN
20.0000 mg | Freq: Once | INTRAVENOUS | Status: AC
  Administered 2022-01-16: 20 mg via INTRAVENOUS
  Filled 2022-01-16: qty 50

## 2022-01-16 MED ORDER — ONDANSETRON HCL 4 MG/2ML IJ SOLN
4.0000 mg | Freq: Once | INTRAMUSCULAR | Status: AC
  Administered 2022-01-16: 4 mg via INTRAVENOUS
  Filled 2022-01-16: qty 2

## 2022-01-16 NOTE — ED Provider Triage Note (Signed)
Emergency Medicine Provider Triage Evaluation Note  Dale Mendoza, a 41 y.o. male  was evaluated in triage.  Pt complains of right upper quadrant abdominal pain.  Patient presents to the ED from home, with complaints of right upper quadrant pain while at work.  Patient with a history of pancreatitis reports similar symptoms.  Was evaluated in the ED 2 weeks ago for the same complaints and discharged after short admission.  He denies any fevers, chills, sweats, nausea, vomiting, diarrhea.  Review of Systems  Positive: RUQ abd pain Negative: FCS  Physical Exam  BP (!) 151/84   Pulse 95   Temp 98.2 F (36.8 C)   Resp 18   Wt 127 kg   SpO2 98%   BMI 37.97 kg/m  Gen:   Awake, no distress  NAD Resp:  Normal effort CTA MSK:   Moves extremities without difficulty  CVS:  Soft, nontender  Medical Decision Making  Medically screening exam initiated at 4:59 PM.  Appropriate orders placed.  Benjaman Lobe was informed that the remainder of the evaluation will be completed by another provider, this initial triage assessment does not replace that evaluation, and the importance of remaining in the ED until their evaluation is complete.  Patient to the ED for evaluation of right upper quad abdominal pain.  Patient with history of pancreatitis, presents with onset of pain today while at work.  He denies any other symptoms at this time.   Lissa Hoard, PA-C 01/16/22 1701

## 2022-01-16 NOTE — ED Notes (Signed)
Pt Dc to home. DC instructions reviewed with all questions answered. Pt verbalizes understanding. Unable to obtain vitals. Pt ambulatory out of dept with steady gait with family member.

## 2022-01-16 NOTE — ED Notes (Signed)
See triage note  Presents with upper abd pain   hx of pancreatitis

## 2022-01-16 NOTE — ED Triage Notes (Signed)
Pt POV from home for RUQ pain rt pancreatitis. Pt was recently dc'd but has developed increased pain they believe is a reoccurrence.   Pt denies n/v denies bathroom concerns.

## 2022-01-16 NOTE — ED Provider Notes (Signed)
[facility] Provider Note  Patient Contact: 7:00 PM (approximate)   History   Abdominal Pain   HPI  Dale Mendoza is a 41 y.o. male with a history of diabetes, GERD, hypertension, dyslipidemia and history of recurrent pancreatitis, presents to the emergency department with epigastric abdominal pain that sometimes radiates to the left and right upper abdominal quadrants.  Patient states that he was admitted on 8/22 for similar complaints and received Tylenol, fluids, Zofran and IV insulin given hyperglycemia and hyper triglyceridemia.  Patient states that pain has returned with some nausea but no vomiting.  No fever or chills.  He denies chest tightness or chest pain.  No flank pain or hematuria.  Patient states that his last CT abdomen pelvis occurred in March.      Physical Exam   Triage Vital Signs: ED Triage Vitals  Enc Vitals Group     BP 01/16/22 1637 (!) 151/84     Pulse Rate 01/16/22 1637 95     Resp 01/16/22 1637 18     Temp 01/16/22 1637 98.2 F (36.8 C)     Temp Source 01/16/22 1848 Oral     SpO2 01/16/22 1637 98 %     Weight 01/16/22 1637 279 lb 15.8 oz (127 kg)     Height 01/16/22 1846 6' (1.829 m)     Head Circumference --      Peak Flow --      Pain Score 01/16/22 1637 7     Pain Loc --      Pain Edu? --      Excl. in GC? --     Most recent vital signs: Vitals:   01/16/22 1637 01/16/22 1848  BP: (!) 151/84 (!) 150/78  Pulse: 95 90  Resp: 18 18  Temp: 98.2 F (36.8 C) 98 F (36.7 C)  SpO2: 98% 98%     General: Alert and in no acute distress. Eyes:  PERRL. EOMI. Head: No acute traumatic findings ENT:      Nose: No congestion/rhinnorhea.      Mouth/Throat: Mucous membranes are moist. Neck: No stridor. No cervical spine tenderness to palpation. Cardiovascular:  Good peripheral perfusion Respiratory: Normal respiratory effort without tachypnea or retractions. Lungs CTAB. Good air entry to the bases with no decreased or  absent breath sounds. Gastrointestinal: Bowel sounds 4 quadrants.  Patient has epigastric abdominal pain to palpation with guarding.  No palpable masses. No distention. No CVA tenderness. Musculoskeletal: Full range of motion to all extremities.  Neurologic:  No gross focal neurologic deficits are appreciated.  Skin:   No rash noted Other:   ED Results / Procedures / Treatments   Labs (all labs ordered are listed, but only abnormal results are displayed) Labs Reviewed  COMPREHENSIVE METABOLIC PANEL - Abnormal; Notable for the following components:      Result Value   Glucose, Bld 177 (*)    AST 74 (*)    ALT 56 (*)    All other components within normal limits  URINALYSIS, ROUTINE W REFLEX MICROSCOPIC - Abnormal; Notable for the following components:   Color, Urine YELLOW (*)    APPearance HAZY (*)    Glucose, UA 150 (*)    Protein, ur 100 (*)    All other components within normal limits  TRIGLYCERIDES - Abnormal; Notable for the following components:   Triglycerides 305 (*)    All other components within normal limits  LIPASE, BLOOD  CBC WITH DIFFERENTIAL/PLATELET  TROPONIN I (  HIGH SENSITIVITY)  TROPONIN I (HIGH SENSITIVITY)     EKG  Normal sinus rhythm without ST segment elevation or other apparent arrhythmia.   RADIOLOGY  I personally viewed and evaluated these images as part of my medical decision making, as well as reviewing the written report by the radiologist.  ED Provider Interpretation: CT abdomen pelvis unremarkable.  Normal-appearing gallbladder and pancreas.   PROCEDURES:  Critical Care performed: No  Procedures   MEDICATIONS ORDERED IN ED: Medications  morphine (PF) 4 MG/ML injection 4 mg (4 mg Intravenous Given 01/16/22 1920)  ondansetron (ZOFRAN) injection 4 mg (4 mg Intravenous Given 01/16/22 1922)  sodium chloride 0.9 % bolus 1,000 mL (0 mLs Intravenous Stopped 01/16/22 2130)  iohexol (OMNIPAQUE) 300 MG/ML solution 100 mL (100 mLs Intravenous  Contrast Given 01/16/22 1946)  famotidine (PEPCID) IVPB 20 mg premix (0 mg Intravenous Stopped 01/16/22 2223)  alum & mag hydroxide-simeth (MAALOX/MYLANTA) 200-200-20 MG/5ML suspension 30 mL (30 mLs Oral Given 01/16/22 2130)     IMPRESSION / MDM / ASSESSMENT AND PLAN / ED COURSE  I reviewed the triage vital signs and the nursing notes.                              Assessment and plan  Abdominal pain 41 year old male with history of recurrent pancreatitis and past medical history detailed above, presents to the emergency department with resurgence of epigastric abdominal pain that radiates to the back.  Patient was hypertensive at triage but vital signs were otherwise reassuring.  Patient was alert and nontoxic-appearing.  He did have epigastric abdominal tenderness to palpation. I reviewed labs obtained at triage and patient had reassuring CBC and CMP.  Patient has significant hyperglycemia during prior admission and glucose was 177 today.  Lipase within range.  I reviewed medical record and see the patient has not had a CT abdomen pelvis since March 2023.  Will give IV morphine for pain and obtain CT abdomen pelvis and will reassess.  CT abdomen pelvis reassuring without evidence of pancreatitis or cholecystitis.  Patient was given GI cocktail and his pain improved.  Recommended daily Pepcid in addition to Protonix and follow-up with GI.  FINAL CLINICAL IMPRESSION(S) / ED DIAGNOSES   Final diagnoses:  Epigastric abdominal pain     Rx / DC Orders   ED Discharge Orders     None        Note:  This document was prepared using Dragon voice recognition software and may include unintentional dictation errors.   [name] [place], PA-C 01/16/22 2328    Arnaldo Natal, MD 01/17/22 [id number]

## 2022-01-16 NOTE — Discharge Instructions (Signed)
Continue taking your Protonix at home. Please make follow-up appointment with GI. Take 40 mg of Pepcid once daily.

## 2023-02-28 ENCOUNTER — Other Ambulatory Visit: Payer: Self-pay | Admitting: Family Medicine

## 2023-02-28 DIAGNOSIS — R1011 Right upper quadrant pain: Secondary | ICD-10-CM

## 2023-03-08 ENCOUNTER — Ambulatory Visit
Admission: RE | Admit: 2023-03-08 | Discharge: 2023-03-08 | Disposition: A | Discharge status: Home / Self Care | Payer: Medicaid Other | Source: Ambulatory Visit | Attending: Family Medicine | Admitting: Family Medicine

## 2023-03-08 DIAGNOSIS — R1011 Right upper quadrant pain: Secondary | ICD-10-CM | POA: Insufficient documentation

## 2023-07-08 ENCOUNTER — Ambulatory Visit
Admission: EM | Admit: 2023-07-08 | Discharge: 2023-07-08 | Disposition: A | Discharge status: Home / Self Care | Payer: Medicaid Other

## 2023-07-08 ENCOUNTER — Encounter: Payer: Self-pay | Admitting: *Deleted

## 2023-07-08 DIAGNOSIS — R051 Acute cough: Secondary | ICD-10-CM

## 2023-07-08 DIAGNOSIS — J22 Unspecified acute lower respiratory infection: Secondary | ICD-10-CM

## 2023-07-08 MED ORDER — DOXYCYCLINE HYCLATE 100 MG PO CAPS: 100.0000 mg | ORAL_CAPSULE | Freq: Two times a day (BID) | ORAL | 0 refills | Status: AC

## 2023-07-08 NOTE — ED Provider Notes (Signed)
 Dale Mendoza    CSN: [id number] Arrival date & time: 07/08/23  1203      History   Chief Complaint Chief Complaint  Patient presents with   Headache   Otalgia   Cough    HPI Dale Mendoza is a 43 y.o. male.   43 year old male patient, Dale Mendoza, presents to urgent care for evaluation of 2 weeks of cough and congestion headache and bilateral ear pain.  Patient states he is taken over-the-counter Sudafed for symptom management  PMH: Hypertension, diabetes, hyperlipidemia, pancreatitis, GERD  The history is provided by the patient. No language interpreter was used.    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 Authorizing Provider  amLODipine (NORVASC) 10 MG tablet Take 10 mg by mouth daily. 06/21/23  Yes [provider]  Continuous Glucose Receiver (DEXCOM G7  RECEIVER) DEVI  02/27/23  Yes [provider]  doxycycline (VIBRAMYCIN) 100 MG capsule Take 1 capsule (100 mg total) by mouth 2 (two) times daily for 7 days. 07/08/23 07/15/23 Yes Kristy Schomburg, Para March, NP  furosemide (LASIX) 40 MG tablet Take 40 mg by mouth daily. 06/07/23  Yes [provider]  HUMALOG KWIKPEN 100 UNIT/ML KwikPen Inject into the skin. 06/26/23  Yes [provider]  pregabalin (LYRICA) 75 MG capsule Take 75 mg by mouth 3 (three) times daily. 06/26/23  Yes [provider]  albuterol (PROVENTIL HFA;VENTOLIN HFA) 108 (90 Base) MCG/ACT inhaler Inhale 1-2 puffs into the lungs every 6 (six) hours as needed for wheezing or shortness of breath. 10/04/15   Fisher, Roselyn Bering, PA-C  atorvastatin (LIPITOR) 40 MG tablet Take 1 tablet (40 mg total) by mouth daily. 01/12/22   Enedina Finner, MD  Cetirizine HCl 10 MG CAPS Take 1 tablet by mouth daily.    [provider]  fenofibrate (TRICOR) 145 MG tablet Take 1 tablet (145 mg total) by mouth daily. Will need fasting labs in july 01/12/22   Enedina Finner, MD  fexofenadine (ALLEGRA) 60 MG tablet Take 1 tablet (60 mg total) by mouth daily. 01/12/22   Enedina Finner, MD  fluticasone (FLONASE) 50 MCG/ACT nasal spray Place 1 spray into both nostrils 2 (two) times daily. 10/04/15   Fisher, Roselyn Bering, PA-C  glimepiride (AMARYL) 4 MG tablet Take 1 tablet (4 mg total) by mouth daily. 01/12/22 08/12/26  Enedina Finner, MD  ibuprofen (ADVIL) 400 MG tablet Take  1 tablet (400 mg total) by mouth every 6 (six) hours as needed for mild pain or headache. 08/16/21   Esaw Grandchild A, DO  Insulin Pen Needle (NOVOFINE) 30G X 8 MM MISC Inject 10 each into the skin as needed. 12/18/16   Houston Siren, MD  lisinopril (ZESTRIL) 20 MG tablet Take 1 tablet (20 mg total) by mouth 2 (two) times daily. 01/12/22   Enedina Finner, MD  metFORMIN (GLUCOPHAGE-XR) 500 MG 24 hr tablet Take 2 tablets (1,000 mg total) by mouth 2 (two) times daily. 01/12/22   Enedina Finner, MD   Multiple Vitamins-Minerals (ALIVE DIABETIC MULTIVITAMIN PO) Take 1 tablet by mouth daily.    [provider]  Omega-3 Fatty Acids (FISH OIL) 1200 MG CAPS Take by mouth 2 (two) times daily.    [provider]  ondansetron (ZOFRAN) 4 MG tablet Take 1 tablet (4 mg total) by mouth every 8 (eight) hours as needed for nausea or vomiting. 08/16/21   Esaw Grandchild A, DO  ONE TOUCH ULTRA TEST test strip USE AS DIRECTED THREE TIMES A DAY 12/29/16   Sherrie Mustache Roselyn Bering, PA-C  pantoprazole (PROTONIX) 40 MG tablet Take 40 mg by mouth 2 (two) times daily. 07/03/21   [provider]  TRESIBA FLEXTOUCH 200 UNIT/ML FlexTouch Pen Inject 160 Units into the skin at bedtime. 01/12/22   Enedina Finner, MD    Family History Family History  Problem Relation Age of Onset   Hyperlipidemia Father    Colon cancer Father    Pancreatic cancer Paternal Grandfather     Social History Social History   Tobacco Use   Smoking status: Former    Current packs/day: 0.00    Types: Cigarettes    Quit date: 05/22/2013    Years since quitting: 10.1   Smokeless tobacco: Current    Types: Chew   Tobacco comments:    Occasional about a pack a week  Vaping Use   Vaping status: Never Used  Substance Use Topics   Alcohol use: Not Currently   Drug use: No     Allergies   Patient has no known allergies.   Review of Systems Review of Systems  Constitutional:  Negative for fever.  HENT:  Positive for congestion and ear pain.   Respiratory:  Positive for cough.   Neurological:  Positive for headaches.  All other systems reviewed and are negative.    Physical Exam Triage Vital Signs ED Triage Vitals  Encounter Vitals Group     BP 07/08/23 1312 136/82     Systolic BP Percentile --      Diastolic BP Percentile --      Pulse Rate 07/08/23 1312 (!) 108     Resp 07/08/23 1312 20     Temp 07/08/23 1312 98.6 F (37 C)     Temp Source 07/08/23 1312 Oral     SpO2 07/08/23 1312 95 %     Weight  07/08/23 1309 285 lb (129.3 kg)     Height 07/08/23 1309 6' (1.829 m)     Head Circumference --      Peak Flow --      Pain Score 07/08/23 1308 9     Pain Loc --      Pain Education --      Exclude from Growth Chart --    No data found.  Updated Vital Signs BP 136/82 (BP Location: Left Arm)   Pulse (!) 108   Temp 98.6 F (37 C) (Oral)  Resp 20   Ht 6' (1.829 m)   Wt 285 lb (129.3 kg)   SpO2 95%   BMI 38.65 kg/m   Visual Acuity Right Eye Distance:   Left Eye Distance:   Bilateral Distance:    Right Eye Near:   Left Eye Near:    Bilateral Near:     Physical Exam Vitals and nursing note reviewed.  Constitutional:      General: He is not in acute distress.    Appearance: He is well-developed and well-groomed.  HENT:     Head: Normocephalic and atraumatic.     Right Ear: Tympanic membrane is retracted.     Left Ear: Tympanic membrane is retracted.     Nose: Mucosal edema and congestion present.     Right Sinus: Maxillary sinus tenderness present.     Left Sinus: Maxillary sinus tenderness present.     Mouth/Throat:     Pharynx: Postnasal drip present.  Eyes:     Conjunctiva/sclera: Conjunctivae normal.  Cardiovascular:     Rate and Rhythm: Normal rate and regular rhythm.     Heart sounds: Normal heart sounds. No murmur heard. Pulmonary:     Effort: Pulmonary effort is normal. No respiratory distress.     Breath sounds: Normal breath sounds and air entry.  Abdominal:     Palpations: Abdomen is soft.     Tenderness: There is no abdominal tenderness.  Musculoskeletal:        General: No swelling.     Cervical back: Neck supple.  Skin:    General: Skin is warm and dry.     Capillary Refill: Capillary refill takes less than 2 seconds.  Neurological:     General: No focal deficit present.     Mental Status: He is alert and oriented to person, place, and time.     GCS: GCS eye subscore is 4. GCS verbal subscore is 5. GCS motor subscore is 6.     Cranial Nerves:  No cranial nerve deficit.     Sensory: No sensory deficit.  Psychiatric:        Attention and Perception: Attention normal.        Mood and Affect: Mood normal.        Speech: Speech normal.        Behavior: Behavior normal. Behavior is cooperative.      UC Treatments / Results  Labs (all labs ordered are listed, but only abnormal results are displayed) Labs Reviewed - No data to display  EKG   Radiology No results found.  Procedures Procedures (including critical care time)  Medications Ordered in UC Medications - No data to display  Initial Impression / Assessment and Plan / UC Course  I have reviewed the triage vital signs and the nursing notes.  Pertinent labs & imaging results that were available during my care of the patient were reviewed by me and considered in my medical decision making (see chart for details).    Discussed exam findings and plan of care with patient, strict go to ER precautions given.   Patient verbalized understanding to this provider.  Ddx: Acute respiratory infection, acute cough, viral illness, allergies Final Clinical Impressions(s) / UC Diagnoses   Final diagnoses:  Acute respiratory infection  Acute cough     Discharge Instructions      Take antibiotic as directed(doxycycline) Push fluids May take Coricidin HBP for symptom management Follow-up with PCP Go to the ER if you have new or worsening symptoms(chest  pain, shortness of breath, palpitations etc.)     ED Prescriptions     Medication Sig Dispense Auth. Provider   doxycycline (VIBRAMYCIN) 100 MG capsule Take 1 capsule (100 mg total) by mouth 2 (two) times daily for 7 days. 14 capsule Tyreisha Ungar, Para March, NP      PDMP not reviewed this encounter.   Clancy Gourd, NP 07/08/23 1356

## 2023-07-08 NOTE — ED Triage Notes (Signed)
 Patient states 2 weeks of cough/congestion, headache and bilateral ear pain.  Taking OTC Sudafed.

## 2023-07-08 NOTE — Discharge Instructions (Addendum)
 Take antibiotic as directed(doxycycline) Push fluids May take Coricidin HBP for symptom management Follow-up with PCP Go to the ER if you have new or worsening symptoms(chest pain, shortness of breath, palpitations etc.)

## 2023-12-16 ENCOUNTER — Emergency Department: Admission: EM | Admit: 2023-12-16 | Discharge: 2023-12-16 | Disposition: A | Discharge status: Home / Self Care

## 2023-12-16 ENCOUNTER — Emergency Department

## 2023-12-16 ENCOUNTER — Other Ambulatory Visit: Payer: Self-pay

## 2023-12-16 DIAGNOSIS — R059 Cough, unspecified: Secondary | ICD-10-CM | POA: Diagnosis not present

## 2023-12-16 DIAGNOSIS — I509 Heart failure, unspecified: Secondary | ICD-10-CM | POA: Diagnosis not present

## 2023-12-16 DIAGNOSIS — I110 Hypertensive heart disease with heart failure: Secondary | ICD-10-CM | POA: Insufficient documentation

## 2023-12-16 DIAGNOSIS — J45909 Unspecified asthma, uncomplicated: Secondary | ICD-10-CM | POA: Insufficient documentation

## 2023-12-16 DIAGNOSIS — R0602 Shortness of breath: Secondary | ICD-10-CM | POA: Diagnosis present

## 2023-12-16 DIAGNOSIS — E119 Type 2 diabetes mellitus without complications: Secondary | ICD-10-CM | POA: Insufficient documentation

## 2023-12-16 LAB — COMPREHENSIVE METABOLIC PANEL WITH GFR
ALT: 38 U/L (ref 0–44)
AST: 45 U/L — ABNORMAL HIGH (ref 15–41)
Albumin: 4 g/dL (ref 3.5–5.0)
Alkaline Phosphatase: 81 U/L (ref 38–126)
Anion gap: 15 (ref 5–15)
BUN: 19 mg/dL (ref 6–20)
CO2: 23 mmol/L (ref 22–32)
Calcium: 9.4 mg/dL (ref 8.9–10.3)
Chloride: 101 mmol/L (ref 98–111)
Creatinine, Ser: 0.91 mg/dL (ref 0.61–1.24)
GFR, Estimated: >60 mL/min (ref >60)
Glucose, Bld: 272 mg/dL — ABNORMAL HIGH (ref 70–99)
Potassium: 3.9 mmol/L (ref 3.5–5.1)
Sodium: 139 mmol/L (ref 135–145)
Total Bilirubin: 0.5 mg/dL (ref 0.0–1.2)
Total Protein: 6.5 g/dL (ref 6.5–8.1)

## 2023-12-16 LAB — BRAIN NATRIURETIC PEPTIDE: B Natriuretic Peptide: 14.5 pg/mL (ref 0.0–100.0)

## 2023-12-16 LAB — CBC
HCT: 41.1 % (ref 39.0–52.0)
Hemoglobin: 13.7 g/dL (ref 13.0–17.0)
MCH: 28.5 pg (ref 26.0–34.0)
MCHC: 33.3 g/dL (ref 30.0–36.0)
MCV: 85.6 fL (ref 80.0–100.0)
Platelets: 311 K/uL (ref 150–400)
RBC: 4.8 MIL/uL (ref 4.22–5.81)
RDW: 13.8 % (ref 11.5–15.5)
WBC: 8.7 K/uL (ref 4.0–10.5)
nRBC: 0 % (ref 0.0–0.2)

## 2023-12-16 LAB — RESP PANEL BY RT-PCR (RSV, FLU A&B, COVID)  RVPGX2
Influenza A by PCR: NEGATIVE
Influenza B by PCR: NEGATIVE
Resp Syncytial Virus by PCR: NEGATIVE
SARS Coronavirus 2 by RT PCR: NEGATIVE

## 2023-12-16 LAB — LIPASE, BLOOD: Lipase: 34 U/L (ref 11–51)

## 2023-12-16 LAB — CBG MONITORING, ED: Glucose-Capillary: 238 mg/dL — ABNORMAL HIGH (ref 70–99)

## 2023-12-16 MED ORDER — ALBUTEROL SULFATE HFA 108 (90 BASE) MCG/ACT IN AERS: 2.0000 | INHALATION_SPRAY | Freq: Four times a day (QID) | RESPIRATORY_TRACT | 2 refills | Status: AC | PRN

## 2023-12-16 MED ORDER — IPRATROPIUM-ALBUTEROL 0.5-2.5 (3) MG/3ML IN SOLN
3.0000 mL | Freq: Once | RESPIRATORY_TRACT | Status: AC
  Administered 2023-12-16: 3 mL via RESPIRATORY_TRACT
  Filled 2023-12-16: qty 3

## 2023-12-16 MED ORDER — PREDNISONE 20 MG PO TABS
40.0000 mg | ORAL_TABLET | Freq: Every day | ORAL | 0 refills | Status: AC
Start: 2023-12-17 — End: 2023-12-21

## 2023-12-16 MED ORDER — ALBUTEROL SULFATE HFA 108 (90 BASE) MCG/ACT IN AERS: 2.0000 | INHALATION_SPRAY | Freq: Four times a day (QID) | RESPIRATORY_TRACT | 2 refills | Status: DC | PRN

## 2023-12-16 MED ORDER — METHYLPREDNISOLONE SODIUM SUCC 125 MG IJ SOLR
125.0000 mg | Freq: Once | INTRAMUSCULAR | Status: AC
  Administered 2023-12-16: 125 mg via INTRAVENOUS
  Filled 2023-12-16: qty 2

## 2023-12-16 MED ORDER — PREDNISONE 20 MG PO TABS: 40.0000 mg | ORAL_TABLET | Freq: Every day | ORAL | 0 refills | Status: DC

## 2023-12-16 NOTE — ED Triage Notes (Signed)
 Pt presents via POV c/o SOB. Reports has hx of CHF on diuretics. Reports has not been weighing himself but feels like it has something to do with fluid. Also reports multiple episodes of acid reflex since last Sunday.   Also report headache.

## 2023-12-16 NOTE — ED Provider Notes (Signed)
 [facility] Provider Note    Event Date/Time   First MD Initiated Contact with Patient 12/16/23 2044     (approximate)   History   Shortness of Breath  FIRST NURSE NOTE:  Pt reports shortness of breath pt is short of breath with exertion.  Pt presents via POV c/o SOB. Reports has hx of CHF on diuretics. Reports has not been weighing himself but feels like it has something to do with fluid. Also reports multiple episodes of acid reflex since last Sunday.   Also report headache.    HPI Dale Mendoza is a 43 y.o. male PMH hypertension, pancreatitis, diabetes, familial hypertriglyceridemia presents for evaluation of shortness of breath -Gradually worsening over the past several days, notably worse throughout the day today -Does have chronic issues with acid reflux and did have a couple episodes of vomiting today.  Has also had recent cough.  Separately has been followed by his outpatient provider for edema, has been prescribed diuretic but has not been taking this regularly.  Contrary to triage note, no known history of CHF. -No fevers, no chest or abdominal pain     Physical Exam   Triage Vital Signs: ED Triage Vitals [12/16/23 2041]  Encounter Vitals Group     BP (!) 166/92     Girls Systolic BP Percentile      Girls Diastolic BP Percentile      Boys Systolic BP Percentile      Boys Diastolic BP Percentile      Pulse Rate (!) 112     Resp (!) 24     Temp 98.3 F (36.8 C)     Temp Source Oral     SpO2 98 %     Weight      Height      Head Circumference      Peak Flow      Pain Score 10     Pain Loc      Pain Education      Exclude from Growth Chart     Most recent vital signs: Vitals:   12/16/23 2300 12/16/23 2330  BP: 110/64 (!) 127/56  Pulse: (!) 102 (!) 101  Resp:  15  Temp:    SpO2: 91% 93%     General: Awake, mild respiratory distress CV:  Good peripheral perfusion.  Mild tachycardia, regular rhythm, RP 2+.  Bilateral lower  extremity edema present. Resp:  Normal effort.  Diffuse inspiratory and expiratory wheezing, no focal coarse breath sounds, no crackles Abd:  No distention. Nontender to deep palpation throughout    ED Results / Procedures / Treatments   Labs (all labs ordered are listed, but only abnormal results are displayed) Labs Reviewed  COMPREHENSIVE METABOLIC PANEL WITH GFR - Abnormal; Notable for the following components:      Result Value   Glucose, Bld 272 (*)    AST 45 (*)    All other components within normal limits  CBG MONITORING, ED - Abnormal; Notable for the following components:   Glucose-Capillary 238 (*)    All other components within normal limits  RESP PANEL BY RT-PCR (RSV, FLU A&B, COVID)  RVPGX2  CBC  BRAIN NATRIURETIC PEPTIDE  LIPASE, BLOOD     EKG  Ecg = sinus tachycardia, rate 108, no gross ST elevation or depression, no significant repolarization normality, normal axis, normal intervals.  No clear evidence of ischemia or arrhythmia on my interpretation.   RADIOLOGY Chest x-ray interpreted by myself  and radiology report reviewed.  No acute pathology identified.    PROCEDURES:  Critical Care performed: No  Procedures   MEDICATIONS ORDERED IN ED: Medications  ipratropium-albuterol  (DUONEB) 0.5-2.5 (3) MG/3ML nebulizer solution 3 mL (3 mLs Nebulization Given 12/16/23 2107)  ipratropium-albuterol  (DUONEB) 0.5-2.5 (3) MG/3ML nebulizer solution 3 mL (3 mLs Nebulization Given 12/16/23 2106)  methylPREDNISolone  sodium succinate (SOLU-MEDROL ) 125 mg/2 mL injection 125 mg (125 mg Intravenous Given 12/16/23 2211)  ipratropium-albuterol  (DUONEB) 0.5-2.5 (3) MG/3ML nebulizer solution 3 mL (3 mLs Nebulization Given 12/16/23 2211)     IMPRESSION / MDM / ASSESSMENT AND PLAN / ED COURSE  I reviewed the triage vital signs and the nursing notes.                              DDX/MDM/AP: Differential diagnosis includes, but is not limited to, reactive airway disease with  notable wheezing on exam, consider underlying CHF, suspect underlying viral syndrome/URI including COVID-19 or influenza, consider underlying pneumonia, doubt ACS, do not clinically suspect PE at this time.  Plan: - Labs  -EKG -chest x-ray - DuoNebs - Reassess  Patient's presentation is most consistent with acute presentation with potential threat to life or bodily function.  The patient is on the cardiac monitor to evaluate for evidence of arrhythmia and/or significant heart rate changes.  ED course below.  Patient feeling much better after 2 rounds of DuoNeb, wheezing almost entirely resolved, remains satting well on room air, work of breathing normalized.  Workup otherwise reassuring with negative COVID/flu, no evidence of underlying CHF or pneumonia.  Presentation overall most consistent with reactive airway disease in the setting of URI.  Treated with 1 dose of IV steroids, discharged on 4-day course of prednisone  as well as albuterol .  Plan for PMD follow-up.  ED return precautions in place.  Patient agrees with plan.  Clinical Course as of 12/17/23 0002  [place] Dec 16, 2023  2058 Cbc wnl [MM]  2103 Chest x-ray with possible cardiomegaly on my read, otherwise unremarkable, radiology report below  IMPRESSION: 1.  No active cardiopulmonary disease is radiographically apparent.   [MM]  2137 CMP reviewed, unremarkable [MM]  2137 BNP wnl [MM]  2208 Patient reevaluated, feeling much better after 2 rounds of DuoNeb.  Wheezing has almost entirely resolved on repeat check, notably increased airflow.  Satting well on room air.  Workup otherwise reassuring with no evidence of CHF.  Viral swab negative.  Presentation most consistent with reactive airway disease.  Suspect underlying viral syndrome as precipitant.  Will give dose of IV steroids and further round of albuterol  with plan for discharge home on course of steroids and albuterol  with plan for outpatient follow-up.  ED return precautions in  place.  Patient agrees with plan. [MM]    Clinical Course User Index [MM] Clarine Ozell LABOR, MD     FINAL CLINICAL IMPRESSION(S) / ED DIAGNOSES   Final diagnoses:  Reactive airway disease without complication, unspecified asthma severity, unspecified whether persistent     Rx / DC Orders   ED Discharge Orders          Ordered    predniSONE  (DELTASONE ) 20 MG tablet  Daily with breakfast,   Status:  Discontinued        12/16/23 2256    predniSONE  (DELTASONE ) 20 MG tablet  Daily with breakfast        12/16/23 2329    albuterol  (VENTOLIN  HFA) 108 (90 Base) MCG/ACT  inhaler  Every 6 hours PRN,   Status:  Discontinued        12/16/23 2256    albuterol  (VENTOLIN  HFA) 108 (90 Base) MCG/ACT inhaler  Every 6 hours PRN        12/16/23 2329             Note:  This document was prepared using Dragon voice recognition software and may include unintentional dictation errors.   Clarine Ozell LABOR, MD 12/17/23 STANLY

## 2023-12-16 NOTE — Discharge Instructions (Signed)
 Your evaluation in the emergency department was notable for wheezing, and this improved with treatment.  I started you on a short course of steroids and prescribed you an inhaler to use at home.  You workup is otherwise reassuring.  Please do follow-up with your primary care provider for reevaluation, and return to the emergency department with any new or worsening symptoms.

## 2023-12-16 NOTE — ED Triage Notes (Addendum)
 FIRST NURSE NOTE:  Pt reports shortness of breath pt is short of breath with exertion.

## 2024-04-11 NOTE — Progress Notes (Unsigned)
 Sleep Medicine   Office Visit  Patient Name: Dale Mendoza DOB: [birth year] MRN [id number]    Chief Complaint: sleep evaluation   Brief History:  Dale Mendoza presents for an initial consult for sleep evaluation and to establish care. Patient has years history of excessive daytime sleepiness. Sleep quality is poor. This is noted most nights. The patient's bed partner reports snoring, choking, gasping and witnessed apneic pauses at night. The patient relates the following symptoms: fatigue, excessive daytime sleepiness, headaches, some trouble concentrating and some memory issues are also present. The patient goes to sleep at 1000 pm and wakes up at 0500 am. Patient reports waking up almost every hours in between and does have trouble returning to sleep.  Sleep quality is the same when outside home environment.  Patient has noted some movement of his legs at night that would disrupt his sleep.  The patient relates sleep talking, vivid dreams and nightmares as unusual behavior during the night.  The patient reports no history of psychiatric problems. The Epworth Sleepiness Score is 23 out of 24. The patient reports cardiovascular risk factors as following: HTN. The patient reports history of asthma.    ROS  General: (-) fever, (-) chills, (-) night sweat Nose and Sinuses: (-) nasal stuffiness or itchiness, (-) postnasal drip, (-) nosebleeds, (-) sinus trouble. Mouth and Throat: (-) sore throat, (-) hoarseness. Neck: (-) swollen glands, (-) enlarged thyroid, (-) neck pain. Respiratory: - cough, + shortness of breath, - wheezing. Neurologic: - numbness, - tingling. Psychiatric: - anxiety, - depression Sleep behavior: -sleep paralysis -hypnogogic hallucinations -dream enactment      -vivid dreams -cataplexy -night terrors -sleep walking   Current Medication: Outpatient Encounter Medications as of 04/14/2024  Medication Sig   albuterol  (VENTOLIN  HFA) 108 (90 Base) MCG/ACT inhaler Inhale 2 puffs into  the lungs every 6 (six) hours as needed for wheezing or shortness of breath.   amLODipine (NORVASC) 10 MG tablet Take 10 mg by mouth daily.   atorvastatin  (LIPITOR) 40 MG tablet Take 1 tablet (40 mg total) by mouth daily.   Cetirizine HCl 10 MG CAPS Take 1 tablet by mouth daily.   Continuous Glucose Receiver (DEXCOM G7 RECEIVER) DEVI    fenofibrate  (TRICOR ) 145 MG tablet Take 1 tablet (145 mg total) by mouth daily. Will need fasting labs in july   fexofenadine  (ALLEGRA ) 60 MG tablet Take 1 tablet (60 mg total) by mouth daily.   fluticasone  (FLONASE ) 50 MCG/ACT nasal spray Place 1 spray into both nostrils 2 (two) times daily.   furosemide (LASIX) 40 MG tablet Take 40 mg by mouth daily.   glimepiride  (AMARYL ) 4 MG tablet Take 1 tablet (4 mg total) by mouth daily.   HUMALOG KWIKPEN 100 UNIT/ML KwikPen Inject into the skin.   ibuprofen  (ADVIL ) 400 MG tablet Take 1 tablet (400 mg total) by mouth every 6 (six) hours as needed for mild pain or headache.   Insulin  Pen Needle (NOVOFINE) 30G X 8 MM MISC Inject 10 each into the skin as needed.   lisinopril  (ZESTRIL ) 20 MG tablet Take 1 tablet (20 mg total) by mouth 2 (two) times daily.   metFORMIN  (GLUCOPHAGE -XR) 500 MG 24 hr tablet Take 2 tablets (1,000 mg total) by mouth 2 (two) times daily.   Multiple Vitamins-Minerals (ALIVE DIABETIC MULTIVITAMIN PO) Take 1 tablet by mouth daily.   Omega-3 Fatty Acids (FISH OIL) 1200 MG CAPS Take by mouth 2 (two) times daily.   ondansetron  (ZOFRAN ) 4 MG tablet Take 1 tablet (  4 mg total) by mouth every 8 (eight) hours as needed for nausea or vomiting.   ONE TOUCH ULTRA TEST test strip USE AS DIRECTED THREE TIMES A DAY   pantoprazole  (PROTONIX ) 40 MG tablet Take 40 mg by mouth 2 (two) times daily.   pregabalin (LYRICA) 75 MG capsule Take 75 mg by mouth 3 (three) times daily.   TRESIBA  FLEXTOUCH 200 UNIT/ML FlexTouch Pen Inject 160 Units into the skin at bedtime.   No facility-administered encounter medications on file  as of 04/14/2024.    Surgical History: Past Surgical History:  Procedure Laterality Date   HERNIA REPAIR     TONSILLECTOMY      Medical History: Past Medical History:  Diagnosis Date   Allergy    seasonal   Diabetes mellitus without complication (HCC)    GERD (gastroesophageal reflux disease)    Hyperlipidemia    Hypertension    Pancreatitis     Family History: Non contributory to the present illness  Social History: Social History   Socioeconomic History   Marital status: Divorced    Spouse name: Not on file   Number of children: Not on file   Years of education: Not on file   Highest education level: Not on file  Occupational History   Not on file  Tobacco Use   Smoking status: Former    Current packs/day: 0.00    Types: Cigarettes    Quit date: 05/22/2013    Years since quitting: 10.8   Smokeless tobacco: Current    Types: Chew   Tobacco comments:    Occasional about a pack a week  Vaping Use   Vaping status: Never Used  Substance and Sexual Activity   Alcohol use: Not Currently   Drug use: No   Sexual activity: Not on file  Other Topics Concern   Not on file  Social History Narrative   Not on file   Social Drivers of Health   Financial Resource Strain: Not on file  Food Insecurity: Not on file  Transportation Needs: Not on file  Physical Activity: Not on file  Stress: Not on file  Social Connections: Not on file  Intimate Partner Violence: Not on file    Vital Signs: There were no vitals taken for this visit. There is no height or weight on file to calculate BMI.   Examination: General Appearance: The patient is well-developed, well-nourished, and in no distress. Neck Circumference: 52 cm Skin: Gross inspection of skin unremarkable. Head: normocephalic, no gross deformities. Eyes: no gross deformities noted. ENT: ears appear grossly normal Neurologic: Alert and oriented. No involuntary movements.    STOP BANG RISK ASSESSMENT S  (snore) Have you been told that you snore?     YES   T (tired) Are you often tired, fatigued, or sleepy during the day?   YES  O (obstruction) Do you stop breathing, choke, or gasp during sleep? YES   P (pressure) Do you have or are you being treated for high blood pressure? YES   B (BMI) Is your body index greater than 35 kg/m? YES   A (age) Are you 62 years old or older? NO   N (neck) Do you have a neck circumference greater than 16 inches?   YES   G (gender) Are you a male? YES   TOTAL STOP/BANG "YES" ANSWERS 7  A STOP-Bang score of 2 or less is considered low risk, and a score of 5 or more is high risk for having either moderate or severe OSA. For people who score 3 or 4, doctors may need to perform further assessment to determine how likely they are to have OSA.         EPWORTH SLEEPINESS SCALE:  Scale:  (0)= no chance of dozing; (1)= slight chance of dozing; (2)= moderate chance of dozing; (3)= high chance of dozing  Chance  Situtation    Sitting and reading: 3    Watching TV: 3    Sitting Inactive in public: 3    As a passenger in car: 3      Lying down to rest: 3    Sitting and talking: 2    Sitting quielty after lunch: 3    In a car, stopped in traffic: 3   TOTAL SCORE:   23 out of 24    SLEEP STUDIES:  None   LABS: No results found for this or any previous visit (from the past 2160 hours).  Radiology: [facility] Chest Port 1 View Result Date: 12/16/2023 CLINICAL DATA:  Shortness of breath. History of congestive heart failure EXAM: PORTABLE CHEST 1 VIEW COMPARISON:  03/11/2021 FINDINGS: Upper normal heart size for AP projection. The lungs appear clear. No blunting of the costophrenic angles. No significant bony abnormalities are identified. IMPRESSION: 1.  No active cardiopulmonary disease is radiographically apparent. Electronically Signed   By: Ryan Salvage M.D.   On: 12/16/2023 21:00     No results found.  No results found.    Assessment and Plan: Patient Active Problem List   Diagnosis Date Noted   Acute respiratory infection 07/08/2023   Acute cough 07/08/2023   Acute pancreatitis 01/10/2022   Uncontrolled type 2 diabetes mellitus with hyperglycemia, with long-term current use of insulin  (HCC) 01/10/2022   GERD without esophagitis 01/10/2022   Essential hypertension 01/10/2022   Familial hypertriglyceridemia 08/15/2021   Tachycardia 08/15/2021   SIRS (systemic inflammatory response syndrome) (HCC) 08/15/2021   Leukocytosis 08/15/2021   Acute recurrent pancreatitis 08/14/2021   Diabetes mellitus without complication (HCC) 08/14/2021   GERD (gastroesophageal reflux disease) 08/14/2021   Hyperlipidemia 08/14/2021   Hypertension 08/14/2021   Recurrent pancreatitis 12/17/2016   1. Witnessed episode of apnea (Primary) Patient evaluation suggests high risk of sleep disordered breathing due to witnessed apnea, snoring, choking and gasping, daytime sleepiness, brain fog, trouble concentration, headaches.  Patient has comorbid cardiovascular risk factors including: hypertension which could be exacerbated by pathologic sleep-disordered breathing.  Suggest: PSG to assess/treat the patient's sleep disordered breathing. The patient was also counselled on weight loss to optimize sleep health.   2. Morbid obesity (HCC) Obesity Counseling: Had a lengthy discussion regarding patients BMI and weight issues. Patient was instructed on portion control as well as increased activity. Also discussed caloric restrictions with trying to maintain intake less than 2000 Kcal. Discussions were made in accordance with the 5As of weight management. Simple actions such as not eating late and if able to, taking a walk is suggested.    General Counseling: I have discussed the findings of the evaluation and examination with Trindon.  I have also discussed any further diagnostic evaluation thatmay  be needed or ordered today. Adalbert verbalizes understanding of the findings of todays visit. We also reviewed his medications today and discussed drug interactions and side effects including but not limited excessive drowsiness and altered mental states. We also discussed that  there is always a risk not just to him but also people around him. he has been encouraged to call the office with any questions or concerns that should arise related to todays visit.  No orders of the defined types were placed in this encounter.       I have personally obtained a history, evaluated the patient, evaluated pertinent data, formulated the assessment and plan and placed orders.   This patient was seen today by Lauraine Lay, PA-C in collaboration with Dr. Elfreda Bathe.   Elfreda DELENA Bathe, MD [facility] Diplomate ABMS Pulmonary and Critical Care Medicine Sleep medicine

## 2024-04-14 ENCOUNTER — Ambulatory Visit (INDEPENDENT_AMBULATORY_CARE_PROVIDER_SITE_OTHER): Payer: Self-pay | Admitting: Internal Medicine

## 2024-04-14 VITALS — BP 144/81 | HR 105 | Resp 16 | Ht 72.0 in | Wt 332.0 lb

## 2024-04-14 DIAGNOSIS — R0681 Apnea, not elsewhere classified: Secondary | ICD-10-CM

## 2024-04-14 NOTE — Patient Instructions (Signed)
